# Patient Record
Sex: Female | Born: 1985
Health system: Southern US, Community
[De-identification: ages and names within clinical notes are randomized; demographics above are authoritative.]

## PROBLEM LIST (undated history)

## (undated) DIAGNOSIS — A6000 Herpesviral infection of urogenital system, unspecified: Secondary | ICD-10-CM

## (undated) DIAGNOSIS — E785 Hyperlipidemia, unspecified: Secondary | ICD-10-CM

## (undated) DIAGNOSIS — R7303 Prediabetes: Secondary | ICD-10-CM

## (undated) DIAGNOSIS — E038 Other specified hypothyroidism: Secondary | ICD-10-CM

## (undated) DIAGNOSIS — R5383 Other fatigue: Secondary | ICD-10-CM

## (undated) DIAGNOSIS — E041 Nontoxic single thyroid nodule: Secondary | ICD-10-CM

## (undated) DIAGNOSIS — Z91018 Allergy to other foods: Secondary | ICD-10-CM

## (undated) DIAGNOSIS — M25569 Pain in unspecified knee: Secondary | ICD-10-CM

## (undated) DIAGNOSIS — D509 Iron deficiency anemia, unspecified: Secondary | ICD-10-CM

## (undated) DIAGNOSIS — T7840XA Allergy, unspecified, initial encounter: Secondary | ICD-10-CM

## (undated) DIAGNOSIS — R1310 Dysphagia, unspecified: Secondary | ICD-10-CM

## (undated) DIAGNOSIS — R944 Abnormal results of kidney function studies: Secondary | ICD-10-CM

## (undated) DIAGNOSIS — G4730 Sleep apnea, unspecified: Secondary | ICD-10-CM

## (undated) DIAGNOSIS — Z973 Presence of spectacles and contact lenses: Secondary | ICD-10-CM

## (undated) DIAGNOSIS — Z8742 Personal history of other diseases of the female genital tract: Secondary | ICD-10-CM

## (undated) DIAGNOSIS — E559 Vitamin D deficiency, unspecified: Secondary | ICD-10-CM

## (undated) DIAGNOSIS — R32 Unspecified urinary incontinence: Secondary | ICD-10-CM

## (undated) DIAGNOSIS — G8929 Other chronic pain: Secondary | ICD-10-CM

## (undated) DIAGNOSIS — E669 Obesity, unspecified: Secondary | ICD-10-CM

## (undated) DIAGNOSIS — N926 Irregular menstruation, unspecified: Secondary | ICD-10-CM

## (undated) DIAGNOSIS — E7800 Pure hypercholesterolemia, unspecified: Secondary | ICD-10-CM

## (undated) DIAGNOSIS — G43909 Migraine, unspecified, not intractable, without status migrainosus: Secondary | ICD-10-CM

## (undated) DIAGNOSIS — E0789 Other specified disorders of thyroid: Secondary | ICD-10-CM

## (undated) HISTORY — DX: Migraine, unspecified, not intractable, without status migrainosus: G43.909

## (undated) HISTORY — DX: Unspecified urinary incontinence: R32

## (undated) HISTORY — DX: Prediabetes: R73.03

## (undated) HISTORY — DX: Herpesviral infection of urogenital system, unspecified: A60.00

## (undated) HISTORY — DX: Obesity, unspecified: E66.9

## (undated) HISTORY — DX: Dysphagia, unspecified: R13.10

## (undated) HISTORY — DX: Vitamin D deficiency, unspecified: E55.9

## (undated) HISTORY — DX: Personal history of other diseases of the female genital tract: Z87.42

## (undated) HISTORY — DX: Allergy, unspecified, initial encounter: T78.40XA

## (undated) HISTORY — DX: Allergy to other foods: Z91.018

## (undated) HISTORY — DX: Irregular menstruation, unspecified: N92.6

## (undated) HISTORY — DX: Sleep apnea, unspecified: G47.30

## (undated) HISTORY — DX: Nontoxic single thyroid nodule: E04.1

## (undated) HISTORY — DX: Pure hypercholesterolemia, unspecified: E78.00

## (undated) HISTORY — PX: WISDOM TOOTH EXTRACTION: SHX21

## (undated) HISTORY — DX: Other fatigue: R53.83

## (undated) HISTORY — DX: Pain in unspecified knee: M25.569

## (undated) HISTORY — DX: Other chronic pain: G89.29

## (undated) HISTORY — DX: Other specified disorders of thyroid: E07.89

---

## 2013-06-12 ENCOUNTER — Other Ambulatory Visit: Payer: Self-pay

## 2013-06-12 LAB — TSH: Thyroid Stimulating Horm: 0.65 u[IU]/mL

## 2013-09-15 ENCOUNTER — Other Ambulatory Visit: Payer: Self-pay

## 2013-09-15 LAB — TSH: Thyroid Stimulating Horm: 1.4 u[IU]/mL

## 2013-11-07 ENCOUNTER — Other Ambulatory Visit: Payer: Self-pay

## 2013-11-07 LAB — TSH: Thyroid Stimulating Horm: 1.6 u[IU]/mL

## 2013-12-13 ENCOUNTER — Inpatient Hospital Stay: Payer: Self-pay | Admitting: Obstetrics & Gynecology

## 2013-12-13 LAB — CBC WITH DIFFERENTIAL/PLATELET
Basophil #: 0.1 10*3/uL (ref 0.0–0.1)
Basophil %: 0.8 %
Eosinophil #: 0 10*3/uL (ref 0.0–0.7)
Eosinophil %: 0.4 %
HCT: 36.4 % (ref 35.0–47.0)
HGB: 11.9 g/dL — ABNORMAL LOW (ref 12.0–16.0)
Lymphocyte #: 3 10*3/uL (ref 1.0–3.6)
Lymphocyte %: 36 %
MCH: 28.6 pg (ref 26.0–34.0)
MCHC: 32.7 g/dL (ref 32.0–36.0)
MCV: 87 fL (ref 80–100)
Monocyte #: 0.7 x10 3/mm (ref 0.2–0.9)
Monocyte %: 8.5 %
Neutrophil #: 4.6 10*3/uL (ref 1.4–6.5)
Neutrophil %: 54.3 %
Platelet: 262 10*3/uL (ref 150–440)
RBC: 4.16 10*6/uL (ref 3.80–5.20)
RDW: 14.6 % — ABNORMAL HIGH (ref 11.5–14.5)
WBC: 8.5 10*3/uL (ref 3.6–11.0)

## 2013-12-15 LAB — HEMATOCRIT: HCT: 27.7 % — AB (ref 35.0–47.0)

## 2013-12-16 LAB — CBC WITH DIFFERENTIAL/PLATELET
BASOS PCT: 0.1 %
Basophil #: 0 10*3/uL (ref 0.0–0.1)
EOS PCT: 0.2 %
Eosinophil #: 0 10*3/uL (ref 0.0–0.7)
HCT: 28.3 % — ABNORMAL LOW (ref 35.0–47.0)
HGB: 9.2 g/dL — ABNORMAL LOW (ref 12.0–16.0)
LYMPHS ABS: 1.8 10*3/uL (ref 1.0–3.6)
Lymphocyte %: 9.8 %
MCH: 28.7 pg (ref 26.0–34.0)
MCHC: 32.5 g/dL (ref 32.0–36.0)
MCV: 88 fL (ref 80–100)
Monocyte #: 0.7 x10 3/mm (ref 0.2–0.9)
Monocyte %: 3.7 %
Neutrophil #: 15.4 10*3/uL — ABNORMAL HIGH (ref 1.4–6.5)
Neutrophil %: 86.2 %
Platelet: 188 10*3/uL (ref 150–440)
RBC: 3.2 10*6/uL — ABNORMAL LOW (ref 3.80–5.20)
RDW: 15.2 % — AB (ref 11.5–14.5)
WBC: 17.9 10*3/uL — ABNORMAL HIGH (ref 3.6–11.0)

## 2013-12-16 LAB — CREATININE, SERUM: CREATININE: 1.01 mg/dL (ref 0.60–1.30)

## 2013-12-18 LAB — WOUND CULTURE

## 2013-12-23 ENCOUNTER — Ambulatory Visit: Payer: Self-pay

## 2013-12-23 IMAGING — US US EXTREM LOW VENOUS BILAT
1 series · 13 of 24 positions shown · non-contrast
Comparison: None.

CLINICAL DATA: Bilateral calf pain, worse on the left, 9 days
postpartum.



[Series 1: us extrem low venous bilat · 0.09mm/px · 13 of 60 slices shown]
[im 1/60]
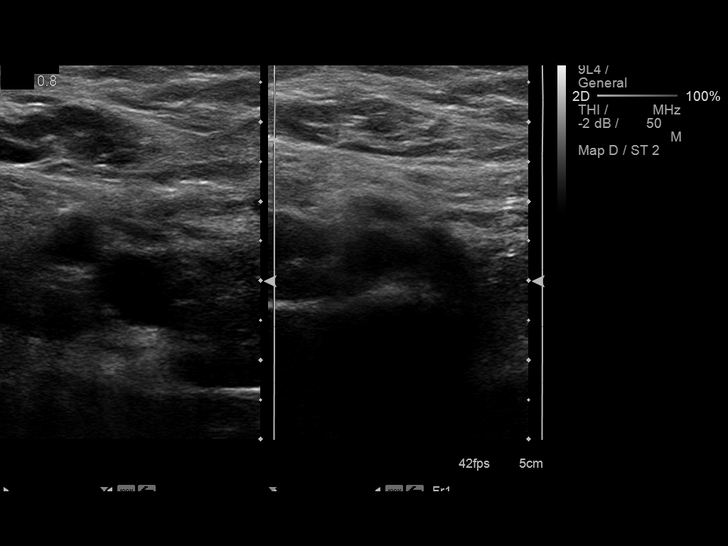
[im 6/60]
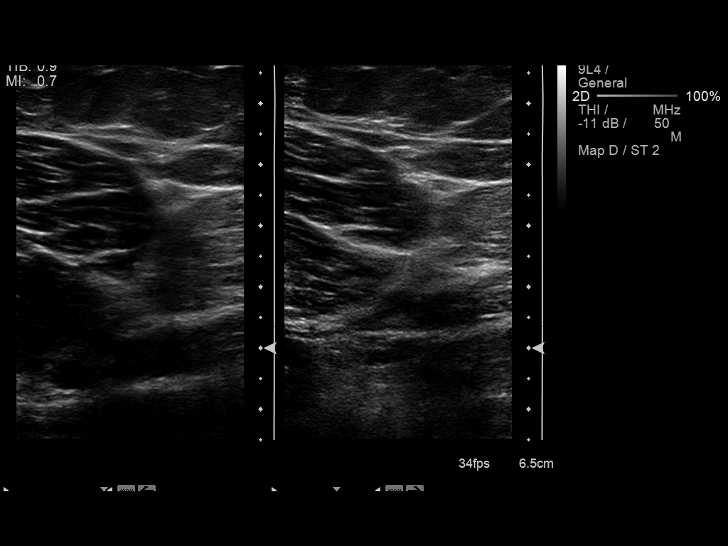
[im 11/60]
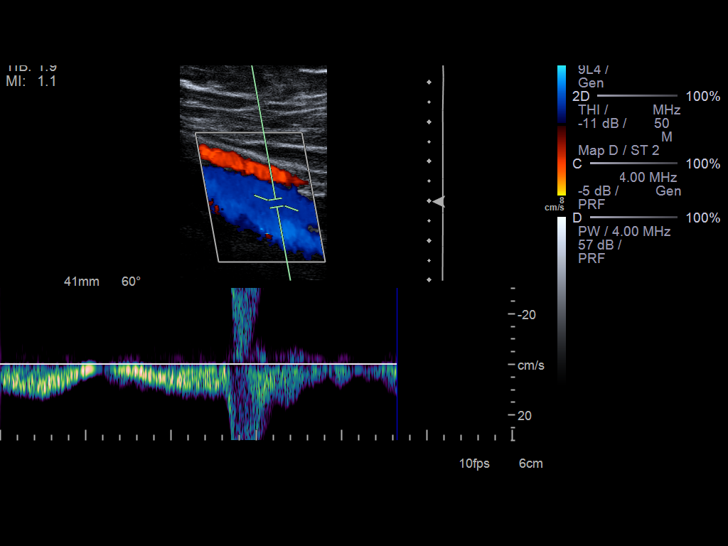
[im 16/60]
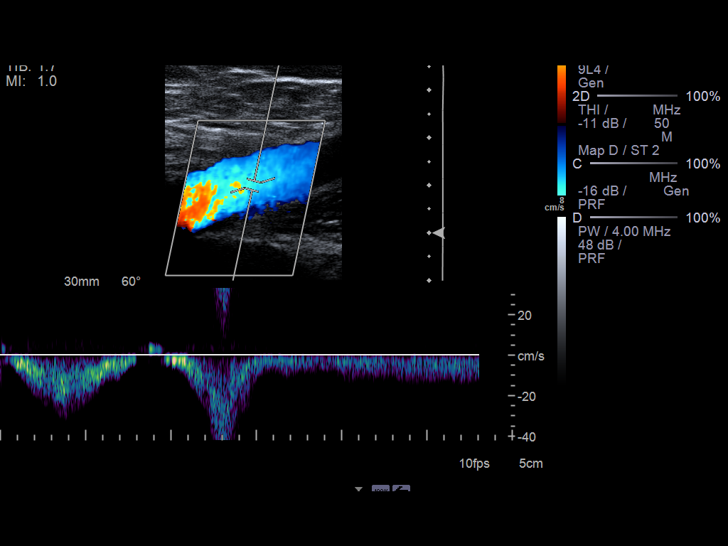
[im 21/60]
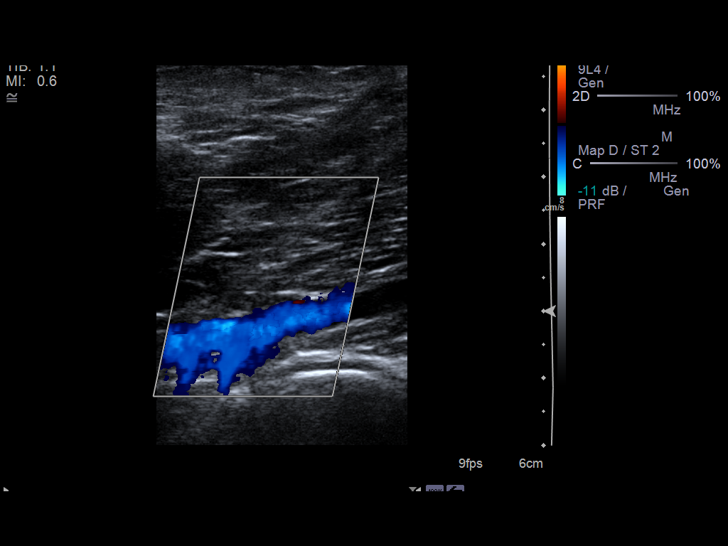
[im 26/60]
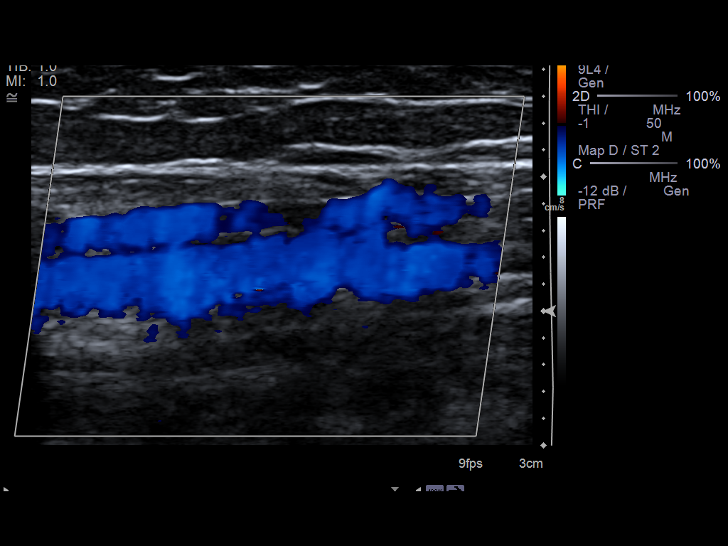
[im 31/60]
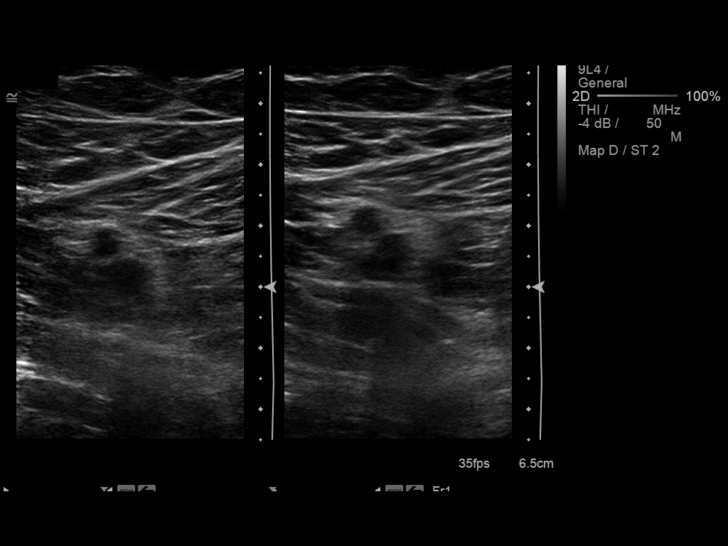
[im 34/60]
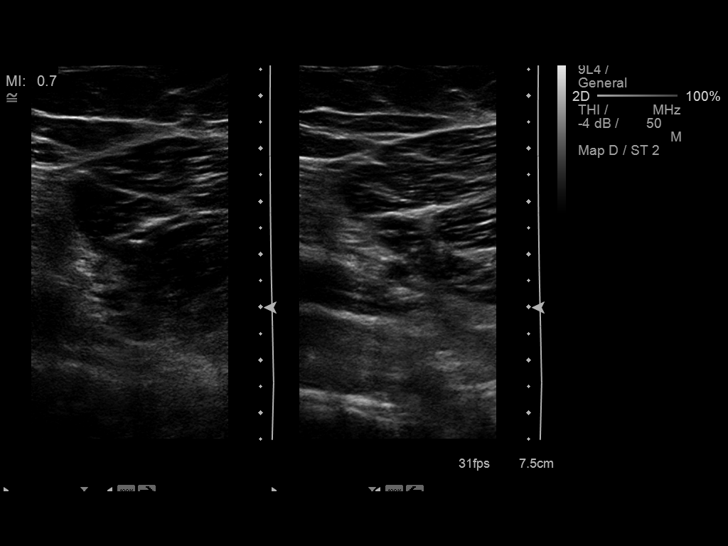
[im 39/60]
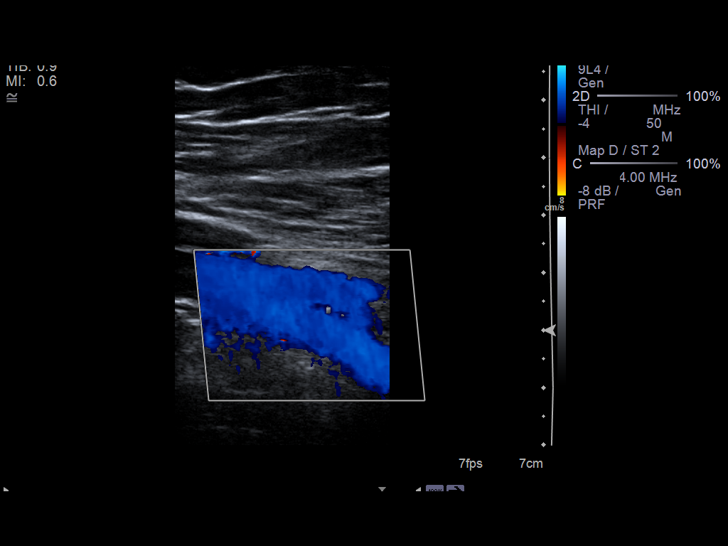
[im 44/60]
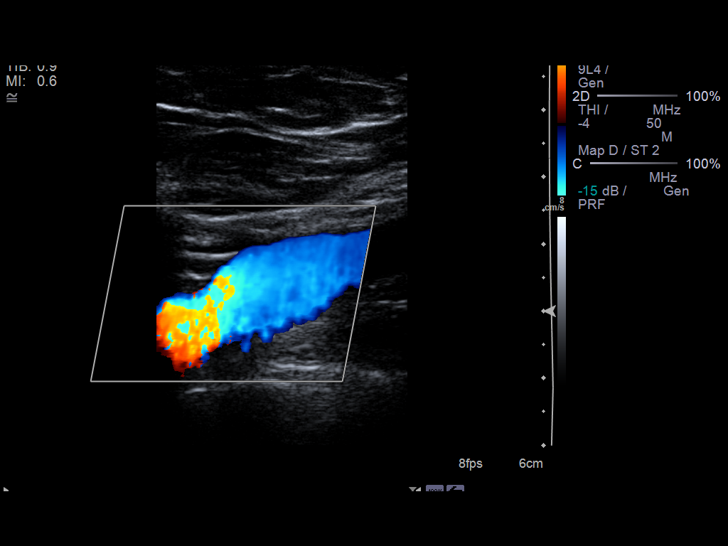
[im 49/60]
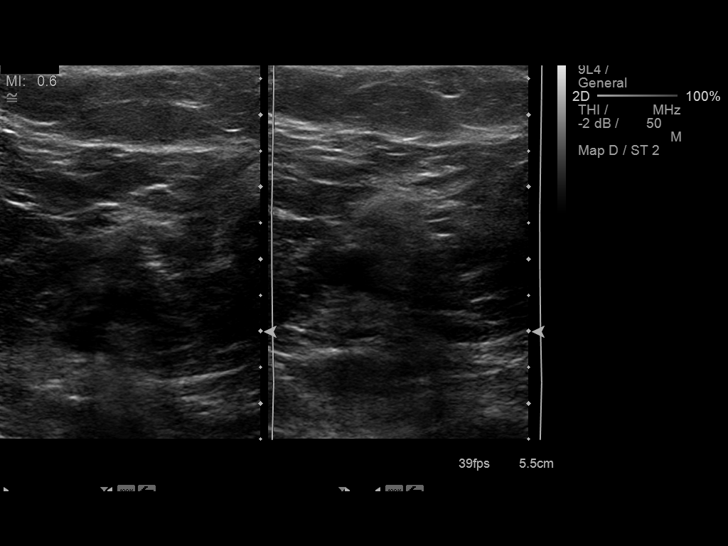
[im 54/60]
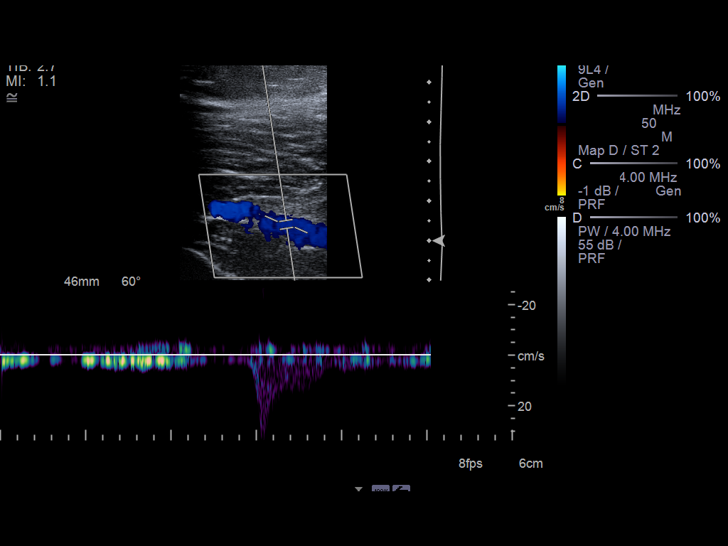
[im 60/60]
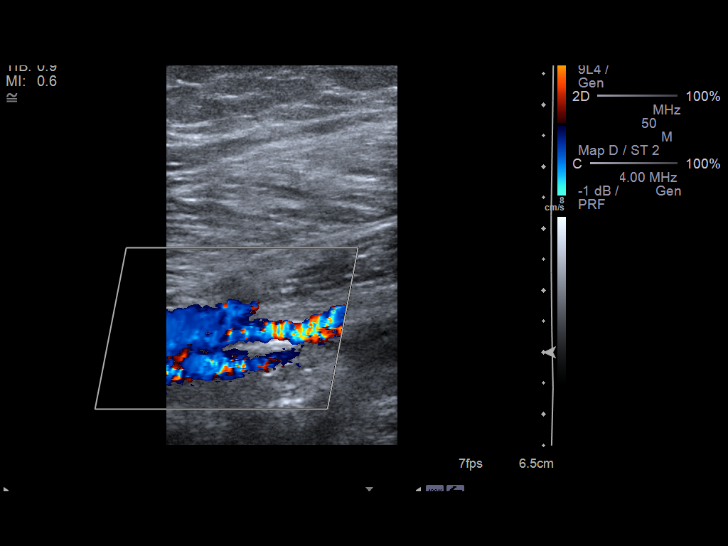

[13 of 24 positions shown; findings below may reference images not displayed]

FINDINGS: RIGHT LOWER EXTREMITY

Common Femoral Vein: No evidence of thrombus. Normal
compressibility, respiratory phasicity and response to augmentation.

Saphenofemoral Junction: No evidence of thrombus. Normal
compressibility and flow on color Doppler imaging.

Profunda Femoral Vein: No evidence of thrombus. Normal
compressibility and flow on color Doppler imaging.

Femoral Vein: No evidence of thrombus. Normal compressibility,
respiratory phasicity and response to augmentation.

Popliteal Vein: No evidence of thrombus. Normal compressibility,
respiratory phasicity and response to augmentation.

Calf Veins: No evidence of thrombus. Normal compressibility and flow
on color Doppler imaging.

Superficial Great Saphenous Vein: No evidence of thrombus. Normal
compressibility and flow on color Doppler imaging.

Venous Reflux:  None.

Other Findings:  None.

LEFT LOWER EXTREMITY

Common Femoral Vein: No evidence of thrombus. Normal
compressibility, respiratory phasicity and response to augmentation.

Saphenofemoral Junction: No evidence of thrombus. Normal
compressibility and flow on color Doppler imaging.

Profunda Femoral Vein: No evidence of thrombus. Normal
compressibility and flow on color Doppler imaging.

Femoral Vein: No evidence of thrombus. Normal compressibility,
respiratory phasicity and response to augmentation.

Popliteal Vein: No evidence of thrombus. Normal compressibility,
respiratory phasicity and response to augmentation.

Calf Veins: No evidence of thrombus. Normal compressibility and flow
on color Doppler imaging.

Superficial Great Saphenous Vein: No evidence of thrombus. Normal
compressibility and flow on color Doppler imaging.

Venous Reflux:  None.

Other Findings:  None.
IMPRESSION: No evidence of deep venous thrombosis.

These results will be called to the ordering clinician or
representative by the [HOSPITAL] at the imaging location.

## 2014-02-03 DIAGNOSIS — E038 Other specified hypothyroidism: Secondary | ICD-10-CM | POA: Insufficient documentation

## 2014-06-06 NOTE — Op Note (Signed)
PATIENT NAME:  Erin Tucker, Erin Tucker DATE OF BIRTH:  [birth year]  DATE OF PROCEDURE:  12/14/2013  PREOPERATIVE DIAGNOSES:   1.  Term intrauterine pregnancy. 2.  Chorioamnionitis. 3.  Arrest of dilation.  POSTPARTUM DIAGNOSIS:  1.  Term intrauterine pregnancy. 2.  Chorioamnionitis. 3.  Arrest of dilation. 4.  Delivery of a live female infant.  SURGEON:  Ranae Plumberhelsea Natausha Jungwirth, MD.  ASSISTANDierdre Searles:  R. Paul Harris, MD  ANESTHESIA:  Epidural.  PROCEDURE:  Primary low transverse cesarean section.   IV FLUIDS:  Crystalloid 800, blood loss 500, urine out 500.   SPECIMENS:   1.  Live female infant weighing 7 pounds 6 ounces with Apgars 7 and 9.   2.  Placenta sent to pathology. 3.  Cord blood. 4.  Blood gas, umbilical cord gas. 5.  Culture of placenta.   FINDINGS:   1.  Live female infant. 2.  A 3-vessel cord.  COMPLICATIONS:  None apparent.  DISPOSITION:  Stable to PACU.  OPERATIVE NOTE:   INDICATIONS FOR PROCEDURE: The patient is a 29 year old G1 P0 who presented with leaking of fluid with spontaneous rupture of membranes at 40 weeks and 0 days.  Pitocin induction with progression to 6 cm and inability to increase Pitocin due to tachysystole.  The patient ultimately developed chorioamnionitis with intrapartum fever of 102.0.  Antibiotics were started and the patient was consent for cesarean.  Prior to onset of the case, the fetal heart rate had a terminal bradycardia to 80 beats per minute.  A STAT section was called, and the patient was immediately taken to the OR.  Upon arrival to the OR, fetal hearts were assessed and in the 120s, thus a STAT section was not proceeded.  The patient had received a bolus of her epidural and we proceeded in an urgent fashion, not an emergent fashion.    DESCRIPTION OF PROCEDURE:  After the epidural was bolused, the patient was prepped and draped in the usual sterile fashion.  The Foley catheter was maintained as she had had an epidural prior.  The  epidural's efficacy was tested and found to be adequate.  A timeout was called.  A 20 blade was used to incise the skin in the Pfannenstiel fashion down to the fascia which was then extended laterally with Mayo scissors.  The fascia was then separated from the rectus abdominis muscles and by using 2 Kocher clamps on the superior aspect of the incision, and using blunt and sharp dissection. The Kochers were then replaced to the posterior portion of the fascia and again blunt and sharp dissection was used to separate the fascia from the rectus abdominis muscles.  The muscles were then divided in the midline and the peritoneum was identified and entered.  Two hemostat clamps were used to lift the peritoneum and Metzenbaum scissors were used to enter; and also laterally extend the peritoneum with careful attempt to avoid the bladder.  The bladder blade was then inserted.  The uterus was inspected and found to be in its usual mid plane position.  A 20 blade was then used to incise the uterus in the lower uterine segment and then entry into the uterine cavity was made manually with the surgeon's finger.  The hysterotomy was then extended laterally with traction.  Clear fluid was noted.  The baby was then delivered cephalic through this incision with a loose nuchal cord noted at the time of delivery.  Once the baby was delivered, 2 Allis clamps were placed  on the cord.  The cord was cut and the bulb suction was used on the oropharynx and the baby was handed off to the awaiting pediatrician.  Cord blood was then collected and after this, a section of cord was then thought to be collected for umbilical cord gas.  The placenta was then delivered using manual massage and traction.  The placenta was set aside in order to be cultured at the end of the procedure.  The uterus was then delivered from the abdominal cavity.  It was wrapped in a wet cloth and a dry cloth was used to evacuate the contents of blood, clots and debris.   Allis clamps were placed on either apex of the hysterotomy incision and a running locking stitch of 0-Vicryl was then used for the initial hysterotomy closure and then a second running stitch of 0-Vicryl was used to imbricate the muscular and serosal layers.   The abdominal cavity was evacuated of extraneous fluid and the uterus was returned.  The paracolic gutters were then cleared of clots and fluid.  The bladder was inspected for damage, of which there was none observed.  The peritoneum was then closed using a running stitch of 3-0 Vicryl and then the On-Q trocars were inserted.  The catheters were placed and tested for patency, and then the fascia was closed using 2 running stitches of 1-0 vicryl from either apex which were tied together at the midline.  The subcutaneous tissues were irrigated with warm water.  After a changing of the gloves, the subcutaneous tissues were reapproximated with 3-0 Vicryl and the skin was closed using a 4-0 Monocryl in a running stitch.  Steri-strips were placed over the incision, and a sterile bandage was applied.  Then 5 mL of bupivacaine were injected through the On-Q catheters and the catheter sites were covered Dermabond and then set with the Tegaderm.    The patient tolerated the procedure.  The counts were correct x 2.  The patient was then recovered in the operating room and brought to PACU in a stable condition.          ____________________________ Elenora Fender Marciano Mundt, MD ccw:DT D: 12/19/2013 10:23:25 ET T: 12/19/2013 11:31:58 ET JOB#: [id number]  cc: Leeroy Bock C. Rivaan Kendall, MD, <Dictator> Leola Brazil MD ELECTRONICALLY SIGNED 12/25/2013 8:23

## 2014-06-08 LAB — SURGICAL PATHOLOGY

## 2014-06-23 NOTE — H&P (Signed)
L&D Evaluation:  History:  HPI 29yo G1P0 @ 40/0 presented to triage with + LOF.  During evaluation FHT deceleration x 2 observed; patient kept for IOL.   -subclinical Hypothyroid on synthroid 50mcg -obese all GTT WNL -Rh negative s/p RhoGAM @ 28w -h/o HSV on valtrex, no lesions, no prodrome -GBS negative  O-/RNI Otho Darner/VI Petra Kuba/RPRNR William Dalton/HIVNR Wilburt Finlay/HBsAb neg   Presents with leaking fluid   Patient's Medical History Thyroid Disease  HSV   Patient's Surgical History none   Medications Pre Natal Vitamins  valtrex 500 bid, synthroid 50mcg daily   Allergies NKDA   Social History none   Family History Non-Contributory   ROS:  ROS All systems were reviewed.  HEENT, CNS, GI, GU, Respiratory, CV, Renal and Musculoskeletal systems were found to be normal.   Exam:  Vital Signs stable   Urine Protein not completed   General no apparent distress   Mental Status clear   Chest clear   Heart normal sinus rhythm   Abdomen gravid, non-tender   Estimated Fetal Weight Average for gestational age   Fetal Position cephalic by ultrasound   Back no CVAT   Edema 1+   Reflexes 2+   Clonus negative   Pelvic no external lesions, 3/50/-3, no herpetic lesions in vagina or cervix.   Mebranes Ruptured, +nitrazine + pooling   Description clear   FHT other, 135 mod + accels, + variable decel to 100 x 40 seconds (2nd decel)   FHT Description Variable decelerations   Fetal Heart Rate 135   Ucx absent   Impression:  Impression fetal decelerations, PROM   Plan:  Plan Pitocin Induction   Comments Patient with PROM and fetal decelerations with spontaneous resolution, confirmed cephalic position.  Will induce with pitocin and observe fetal tolerance to contractions.  Explained to patient and husband a CS will be considered if fetus is intolerant of induction or contractions, and it may be in an emergency fashion.  Consent signed with patient.  Also discussed other reasons for CS including  failure to progress, failure to descend, failed operative delivery, and maternal exhaustion, and that all options will be exhausted prior to suggesting this delivery method.   Electronic Signatures: Ward, Elenora Fenderhelsea C (MD)  (Signed [phone]-Nov-15 11:01)  Authored: L&D Evaluation   Last Updated: 01-Nov-15 11:01 by Ward, Elenora Fenderhelsea C (MD)

## 2016-03-07 DIAGNOSIS — Z6839 Body mass index (BMI) 39.0-39.9, adult: Secondary | ICD-10-CM | POA: Insufficient documentation

## 2016-07-03 HISTORY — PX: THYROIDECTOMY, PARTIAL: SHX18

## 2017-02-08 ENCOUNTER — Telehealth: Payer: Self-pay | Admitting: Obstetrics and Gynecology

## 2017-02-08 ENCOUNTER — Ambulatory Visit (INDEPENDENT_AMBULATORY_CARE_PROVIDER_SITE_OTHER): Payer: BC Managed Care – PPO | Admitting: Certified Nurse Midwife

## 2017-02-08 ENCOUNTER — Encounter: Payer: Self-pay | Admitting: Certified Nurse Midwife

## 2017-02-08 VITALS — BP 110/70 | HR 77 | Ht 61.0 in | Wt 211.0 lb

## 2017-02-08 DIAGNOSIS — Z01419 Encounter for gynecological examination (general) (routine) without abnormal findings: Secondary | ICD-10-CM | POA: Diagnosis not present

## 2017-02-08 DIAGNOSIS — E041 Nontoxic single thyroid nodule: Secondary | ICD-10-CM | POA: Insufficient documentation

## 2017-02-08 DIAGNOSIS — E669 Obesity, unspecified: Secondary | ICD-10-CM | POA: Diagnosis not present

## 2017-02-08 DIAGNOSIS — Z124 Encounter for screening for malignant neoplasm of cervix: Secondary | ICD-10-CM

## 2017-02-08 NOTE — Progress Notes (Signed)
Gynecology Annual Exam  PCP: Patient, No Pcp Per  Chief Complaint:  Chief Complaint  Patient presents with  . Gynecologic Exam    History of Present Illness: Erin Tucker is a 31 y.o.G1P1001 AA female who presents for a NP annual exam. The patient has no complaints today.   Her menses are irregular, they occur every 3-4 months, and they last 4-7 days. Her flow is moderate. She does not have intermenstrual bleeding. Her last menstrual period was 12/04/2016. She denies dysmenorrhea. Last pap smear: 08/06/2014 at her PCP, results were NIL   The patient is  sexually active. She currently uses Nexplanon for contraception. The Nexplanon was inserted 02/03/2014 and is expiring. She does want the Nexplanon replaced  Since her last  visit 02/03/2014, she has had a partial right thyroidectomy for a cold nodule. The pathology returned a follicular adenoma (benign) and she is no longer on Synthroid. She has also gained 19# in the last 3 years.   Her past medical history is remarkable for HSV1 and obesity. Her current BMI is 39.87 kg/m2.   The patient does perform occasional self breast exams. Her last mammogram was NA.  There is no family history of breast cancer.   There is no family history of ovarian cancer.   The patient denies smoking.  She reports rarely drinking alcohol.   She denies illegal drug use.  The patient does not exercise.  She had a lipid panel this year and it was borderline elevated  The patient denies current symptoms of depression.    Review of Systems: Review of Systems  Constitutional: Negative for chills, fever and weight loss.       Positive for weight gain  HENT: Negative for congestion, sinus pain and sore throat.   Eyes: Negative for blurred vision and pain.  Respiratory: Negative for hemoptysis, shortness of breath and wheezing.   Cardiovascular: Negative for chest pain, palpitations and leg swelling.  Gastrointestinal: Negative for  abdominal pain, blood in stool, diarrhea, heartburn, nausea and vomiting.  Genitourinary: Negative for dysuria, frequency, hematuria and urgency.       Positive for irregular menses.  Musculoskeletal: Positive for back pain. Negative for joint pain and myalgias.  Skin: Negative for itching and rash.  Neurological: Negative for dizziness, tingling and headaches.  Endo/Heme/Allergies: Negative for environmental allergies and polydipsia. Does not bruise/bleed easily.       Negative for hirsutism   Psychiatric/Behavioral: Negative for depression. The patient is not nervous/anxious and does not have insomnia.     Past Medical History:  Past Medical History:  Diagnosis Date  . Herpes genitalis   . History of abnormal cervical Pap smear   . Obesity (BMI 35.0-39.9 without comorbidity)   . Thyroid nodule, cold     Past Surgical History:  Past Surgical History:  Procedure Laterality Date  . CESAREAN SECTION  12/14/2013  . THYROIDECTOMY, PARTIAL Right 07/03/2016   follicular adenoma    Family History:  Family History  Problem Relation Age of Onset  . Diabetes Father   . Colon cancer Maternal Grandfather   . Prostate cancer Maternal Grandfather   . Breast cancer Neg Hx   . Ovarian cancer Neg Hx   . Heart disease Neg Hx     Social History:  Social History   Socioeconomic History  . Marital status: Single    Spouse name: Not on file  . Number of children: 1  . Years of education: Not on file  .  Highest education level: Not on file  Social Needs  . Financial resource strain: Not on file  . Food insecurity - worry: Not on file  . Food insecurity - inability: Not on file  . Transportation needs - medical: Not on file  . Transportation needs - non-medical: Not on file  Occupational History  . 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 Smoking status: Never Smoker  . Smokeless tobacco: Never Used  Substance and Sexual Activity  . Alcohol use: Yes    Comment: rare  . Drug use:  No  . Sexual activity: Yes    Partners: Male    Birth control/protection: Implant  Other Topics Concern  . Not on file  Social History Narrative  . Not on file    Allergies:  Allergies  Allergen Reactions  . Mangifera Indica Itching and Swelling  . Other Itching    Red Hi-C drink    Medications:  Current Outpatient Medications on File Prior to Visit  Medication Sig Dispense Refill  . Cholecalciferol (VITAMIN D3) 1000 units CAPS Take by mouth.    . etonogestrel (NEXPLANON) 68 MG IMPL implant 1 each by Subdermal route once.    . Prenatal Multivit-Min-Fe-FA (PRENATAL 1 + IRON PO) Take by mouth.     No current facility-administered medications on file prior to visit.    Physical Exam Vitals:BP 110/70   Pulse 77   Ht 5\' 1"  (1.549 m)   Wt 211 lb (95.7 kg)   LMP 12/04/2016 (Exact Date)   BMI 39.87 kg/m   General: AAF in  NAD HEENT: normocephalic, anicteric Neck: no thyroid enlargement, no palpable nodules, no cervical lymphadenopathy  Pulmonary: No increased work of breathing, CTAB Cardiovascular: RRR, without murmur  Breast: Breasts large and symmetrical, no tenderness, no palpable nodules or masses, no skin or nipple retraction present, no nipple discharge.  No axillary, infraclavicular or supraclavicular lymphadenopathy. Abdomen: Soft, non-tender, non-distended.  Umbilicus without lesions.  No hepatomegaly or masses palpable. No evidence of hernia. Genitourinary:  External: Normal external female genitalia.  Normal urethral meatus, normal  Bartholin's and Skene's glands.    Vagina: Normal vaginal mucosa, no evidence of prolapse.    Cervix: Grossly normal in appearance, no bleeding, non-tender  Uterus: Anteflexed and rotated to the right, normal size, shape, and consistency, mobile, and non-tender  Adnexa: No adnexal masses, non-tender  Rectal: deferred  Lymphatic: no evidence of inguinal lymphadenopathy Extremities: no edema, erythema, or tenderness Neurologic: Grossly  intact Psychiatric: mood appropriate, affect full     Assessment: 31 y.o.NP annual gyn exam Nexplanon expiring  Plan:    1) Breast cancer screening - recommend monthly self breast exam.   2) STI screening was offered and declined.  3) Cervical cancer screening - Pap was done. ASCCP guidelines and rational discussed.  Patient opts for every 3 years screening interval  4) Contraception - Education given regarding options for contraception. Discussed the weight gain she has had on the Nexplanon. Discussed alternative options for contraception. Patinet desires to return for Nexplanon replacement.  5) Routine healthcare maintenance including cholesterol and diabetes screening managed by PCP   6) RTO for Nexplaon replacement in the next month.  Farrel Connersolleen Tanush Drees, CNM

## 2017-02-08 NOTE — Telephone Encounter (Signed)
02/14/17 AT 10:50 WITH ABC FOR NEXPLANON

## 2017-02-10 LAB — IGP, APTIMA HPV
HPV APTIMA: NEGATIVE
PAP SMEAR COMMENT: 0

## 2017-02-11 ENCOUNTER — Encounter: Payer: Self-pay | Admitting: Certified Nurse Midwife

## 2017-02-14 ENCOUNTER — Encounter: Payer: Self-pay | Admitting: Obstetrics and Gynecology

## 2017-02-14 ENCOUNTER — Ambulatory Visit (INDEPENDENT_AMBULATORY_CARE_PROVIDER_SITE_OTHER): Payer: BC Managed Care – PPO | Admitting: Obstetrics and Gynecology

## 2017-02-14 VITALS — BP 110/60 | HR 85 | Ht 61.0 in | Wt 214.0 lb

## 2017-02-14 DIAGNOSIS — Z3046 Encounter for surveillance of implantable subdermal contraceptive: Secondary | ICD-10-CM

## 2017-02-14 DIAGNOSIS — Z30017 Encounter for initial prescription of implantable subdermal contraceptive: Secondary | ICD-10-CM | POA: Diagnosis not present

## 2017-02-14 MED ORDER — ETONOGESTREL 68 MG ~~LOC~~ IMPL: 68.0000 mg | DRUG_IMPLANT | Freq: Once | SUBCUTANEOUS | Status: DC

## 2017-02-14 NOTE — Patient Instructions (Signed)
I value your feedback and entrusting us with your care. If you get a [facility] patient survey, I would appreciate you taking the time to let us know about your experience today. Thank you!  Remove the dressing in 12-24 hours,  keep the incision area dry for 24 hours and remove the Steristrip in 2-3  days.  Notify us if any signs of tenderness, redness, pain, or fevers develop. 

## 2017-02-14 NOTE — Progress Notes (Signed)
   Chief Complaint  Patient presents with  . nexplanon     History of Present Illness:  Erin Tucker is a 32 y.o. that had a nexplanon placed approximately 3 yrs ago. She is here for nexplanon removal and would like a replacement due to expiration. She has irreg bleeding with nexplanon.  Nexplanon removal Procedure note - The Nexplanon was noted in the patient's arm and the end was identified. The skin was cleansed with a Betadine solution. A small injection of subcutaneous lidocaine with epinephrine was given over the end of the implant. An incision was made at the end of the implant. The rod was noted in the incision and grasped with a hemostat. It was noted to be intact.  Steri-Strip was placed approximating the incision. Hemostasis was noted.   Nexplanon Insertion  Patient given informed consent, signed copy in the chart, time out was performed.  Appropriate time out taken.  Patient's LEFT arm was prepped and draped in the usual sterile fashion. The ruler used to measure and mark insertion area.  Pt was prepped with betadine swab and then injected with 1.0 cc of 2% lidocaine with epinephrine. Nexplanon removed form packaging,  Device confirmed in needle, then inserted full length of needle and withdrawn per handbook instructions.  Pt insertion site covered with steri-strip and a bandage.   Minimal blood loss.  Pt tolerated the procedure welL.  Assessment: Nexplanon removal  Nexplanon insertion - Plan: etonogestrel (NEXPLANON) implant 68 mg   Meds ordered this encounter  Medications  . etonogestrel (NEXPLANON) implant 68 mg     Plan:   She was told to remove the dressing in 12-24 hours, to keep the incision area dry for 24 hours and to remove the Steristrip in 2-3  days.  Notify us if any signs of tenderness, redness, pain, or fevers develop.   Alicia B. Copland, PA-C 02/14/2017 11:24 AM

## 2017-04-10 NOTE — Telephone Encounter (Signed)
Nexplanon received 02/14/17

## 2017-06-18 ENCOUNTER — Other Ambulatory Visit: Payer: Self-pay

## 2017-06-18 ENCOUNTER — Ambulatory Visit
Admission: EM | Admit: 2017-06-18 | Discharge: 2017-06-18 | Disposition: A | Discharge status: Home / Self Care | Payer: BC Managed Care – PPO | Attending: Family Medicine | Admitting: Family Medicine

## 2017-06-18 DIAGNOSIS — H1013 Acute atopic conjunctivitis, bilateral: Secondary | ICD-10-CM | POA: Diagnosis not present

## 2017-06-18 MED ORDER — OLOPATADINE HCL 0.2 % OP SOLN: OPHTHALMIC | 0 refills | Status: DC

## 2017-06-18 NOTE — ED Provider Notes (Signed)
[facility]    CSN: [id number] Arrival date & time: 06/18/17  1230  History   Chief Complaint Chief Complaint  Patient presents with  . Eye Problem   HPI  32 year old female presents with the above complaint.  Patient reports that she developed eye itching on Saturday.  Has continued to persist.  No redness.  She does report watery eyes.  She did have some crusting of her left eye this morning.  Her son has recently had conjunctivitis.  No medications or interventions tried.  No visual disturbance.  She does wear contacts.  No other associated symptoms.  No other complaints or concerns at this time.  Past Medical History:  Diagnosis Date  . Herpes genitalis   . History of abnormal cervical Pap smear   . Obesity (BMI 35.0-39.9 without comorbidity)   . Thyroid nodule, cold    Patient Active Problem List   Diagnosis Date Noted  . Obesity (BMI 35.0-39.9 without comorbidity)   . Thyroid nodule, cold    Past Surgical History:  Procedure Laterality Date  . CESAREAN SECTION  12/14/2013  . THYROIDECTOMY, PARTIAL Right 07/03/2016   follicular adenoma   OB History    Gravida  1   Para  1   Term  1   Preterm      AB      Living  1     SAB      TAB      Ectopic      Multiple      Live Births  1          Home Medications    Prior to Admission medications   Medication Sig Start Date End Date Taking? Authorizing Provider  Cholecalciferol (VITAMIN D3) 1000 units CAPS Take by mouth.   Yes [provider]  Prenatal Multivit-Min-Fe-FA (PRENATAL 1 + IRON PO) Take by mouth.   Yes [provider]  Olopatadine HCl 0.2 % SOLN 1 drop to both eyes daily. 06/18/17   Tommie Sams, DO    Family History Family History  Problem Relation Age of Onset  . Diabetes Father   . Colon cancer Maternal Grandfather   . Prostate cancer Maternal Grandfather   . Breast cancer Neg Hx   . Ovarian cancer Neg Hx   . Heart disease Neg Hx     Social  History Social History   Tobacco Use  . Smoking status: Never Smoker  . Smokeless tobacco: Never Used  Substance Use Topics  . Alcohol use: Yes    Comment: rare  . Drug use: No     Allergies   Mangifera indica and Other   Review of Systems Review of Systems  Constitutional: Negative.   Eyes: Positive for itching. Negative for redness and visual disturbance.   Physical Exam Triage Vital Signs ED Triage Vitals  Enc Vitals Group     BP 06/18/17 1249 110/73     Pulse Rate 06/18/17 1249 77     Resp 06/18/17 1249 17     Temp 06/18/17 1249 98.4 F (36.9 C)     Temp Source 06/18/17 1249 Oral     SpO2 06/18/17 1249 100 %     Weight 06/18/17 1247 207 lb (93.9 kg)     Height 06/18/17 1247  (1.549 m)     Head Circumference --      Peak Flow --      Pain Score 06/18/17 1247 0     Pain Loc --  Pain Edu? --      Excl. in GC? --    Updated Vital Signs BP 110/73 (BP Location: Right Arm)   Pulse 77   Temp 98.4 F (36.9 C) (Oral)   Resp 17   Ht  (1.549 m)   Wt 207 lb (93.9 kg)   SpO2 100%   BMI 39.11 kg/m   Visual Acuity Right Eye Distance: 20/25(corrected) Left Eye Distance: 20/25(corrected) Bilateral Distance: 20/15(corrected)  Right Eye Near:   Left Eye Near:    Bilateral Near:     Physical Exam  Constitutional: She is oriented to person, place, and time. She appears well-developed. No distress.  HENT:  Head: Normocephalic and atraumatic.  Eyes: Pupils are equal, round, and reactive to light. Conjunctivae are normal. Right eye exhibits no discharge. Left eye exhibits no discharge.  Cardiovascular: Normal rate and regular rhythm.  Pulmonary/Chest: Effort normal and breath sounds normal.  Neurological: She is alert and oriented to person, place, and time.  Psychiatric: She has a normal mood and affect. Her behavior is normal.  Nursing note and vitals reviewed.  UC Treatments / Results  Labs (all labs ordered are listed, but only abnormal results  are displayed) Labs Reviewed - No data to display  EKG None  Radiology No results found.  Procedures Procedures (including critical care time)  Medications Ordered in UC Medications - No data to display  Initial Impression / Assessment and Plan / UC Course  I have reviewed the triage vital signs and the nursing notes.  Pertinent labs & imaging results that were available during my care of the patient were reviewed by me and considered in my medical decision making (see chart for details).    32 year old female presents with allergic conjunctivitis.  Treating with Pataday.  Final Clinical Impressions(s) / UC Diagnoses   Final diagnoses:  Allergic conjunctivitis of both eyes   ED Prescriptions    Medication Sig Dispense Auth. Provider   Olopatadine HCl 0.2 % SOLN 1 drop to both eyes daily. 2.5 mL Tommie Sams, DO     Controlled Substance Prescriptions [place] Controlled Substance Registry consulted? Not Applicable   Tommie Sams, DO 06/18/17 1346

## 2017-06-18 NOTE — ED Triage Notes (Signed)
Patient complains of bilateral eye itching that started over the weekend. Patient states that she has not noticed any redness but son was just diagnosed with pink eye.

## 2018-03-08 ENCOUNTER — Ambulatory Visit
Admission: EM | Admit: 2018-03-08 | Discharge: 2018-03-08 | Disposition: A | Discharge status: Home / Self Care | Payer: BC Managed Care – PPO | Attending: Family Medicine | Admitting: Family Medicine

## 2018-03-08 ENCOUNTER — Other Ambulatory Visit: Payer: Self-pay

## 2018-03-08 ENCOUNTER — Encounter: Payer: Self-pay | Admitting: Emergency Medicine

## 2018-03-08 DIAGNOSIS — J069 Acute upper respiratory infection, unspecified: Secondary | ICD-10-CM | POA: Insufficient documentation

## 2018-03-08 LAB — RAPID INFLUENZA A&B ANTIGENS (ARMC ONLY): INFLUENZA B (ARMC): NEGATIVE

## 2018-03-08 LAB — RAPID INFLUENZA A&B ANTIGENS: Influenza A (ARMC): NEGATIVE

## 2018-03-08 NOTE — ED Triage Notes (Signed)
Patient c/o chills, cough, body aches x 3 days ago.

## 2018-03-08 NOTE — ED Provider Notes (Signed)
[facility]    CSN: [id number] Arrival date & time: 03/08/18  1018     History   Chief Complaint Chief Complaint  Patient presents with  . Cough  . Generalized Body Aches  . Chills    HPI Erin Tucker is a 33 y.o. female.   The history is provided by the patient.  URI  Presenting symptoms: congestion, cough, fatigue and rhinorrhea   Severity:  Moderate Onset quality:  Sudden Timing:  Constant Progression:  Unchanged Chronicity:  New Relieved by:  None tried Ineffective treatments:  None tried Associated symptoms: myalgias   Associated symptoms: no wheezing   Risk factors: sick contacts     Past Medical History:  Diagnosis Date  . Herpes genitalis   . History of abnormal cervical Pap smear   . Obesity (BMI 35.0-39.9 without comorbidity)   . Thyroid nodule, cold     Patient Active Problem List   Diagnosis Date Noted  . Obesity (BMI 35.0-39.9 without comorbidity)   . Thyroid nodule, cold     Past Surgical History:  Procedure Laterality Date  . CESAREAN SECTION  12/14/2013  . THYROIDECTOMY, PARTIAL Right 07/03/2016   follicular adenoma    OB History    Gravida  1   Para  1   Term  1   Preterm      AB      Living  1     SAB      TAB      Ectopic      Multiple      Live Births  1            Home Medications    Prior to Admission medications   Medication Sig Start Date End Date Taking? Authorizing Provider  Cholecalciferol (VITAMIN D3) 1000 units CAPS Take by mouth.    [provider]  Olopatadine HCl 0.2 % SOLN 1 drop to both eyes daily. 06/18/17   Tommie Sams, DO  Prenatal Multivit-Min-Fe-FA (PRENATAL 1 + IRON PO) Take by mouth.    [provider]    Family History Family History  Problem Relation Age of Onset  . Diabetes Father   . Colon cancer Maternal Grandfather   . Prostate cancer Maternal Grandfather   . Breast cancer Neg Hx   . Ovarian cancer Neg Hx   . Heart disease Neg Hx      Social History Social History   Tobacco Use  . Smoking status: Never Smoker  . Smokeless tobacco: Never Used  Substance Use Topics  . Alcohol use: Yes    Comment: rare  . Drug use: No     Allergies   Mangifera indica and Other   Review of Systems Review of Systems  Constitutional: Positive for fatigue.  HENT: Positive for congestion and rhinorrhea.   Respiratory: Positive for cough. Negative for wheezing.   Musculoskeletal: Positive for myalgias.     Physical Exam Triage Vital Signs ED Triage Vitals  Enc Vitals Group     BP 03/08/18 1100 120/82     Pulse Rate 03/08/18 1100 79     Resp 03/08/18 1100 18     Temp 03/08/18 1100 99.3 F (37.4 C)     Temp Source 03/08/18 1100 Oral     SpO2 03/08/18 1100 100 %     Weight 03/08/18 1059 218 lb (98.9 kg)     Height 03/08/18 1059 5\' 1"  (1.549 m)     Head Circumference --  Peak Flow --      Pain Score 03/08/18 1059 4     Pain Loc --      Pain Edu? --      Excl. in GC? --    No data found.  Updated Vital Signs BP 120/82 (BP Location: Left Arm)   Pulse 79   Temp 99.3 F (37.4 C) (Oral)   Resp 18   Ht 5\' 1"  (1.549 m)   Wt 98.9 kg   SpO2 100%   BMI 41.19 kg/m   Visual Acuity Right Eye Distance:   Left Eye Distance:   Bilateral Distance:    Right Eye Near:   Left Eye Near:    Bilateral Near:     Physical Exam Vitals signs and nursing note reviewed.  Constitutional:      General: She is not in acute distress.    Appearance: Normal appearance. She is well-developed. She is not ill-appearing, toxic-appearing or diaphoretic.  HENT:     Head: Normocephalic and atraumatic.     Right Ear: Tympanic membrane normal.     Left Ear: Tympanic membrane normal.     Nose: Mucosal edema and rhinorrhea present. No nasal deformity, septal deviation or laceration.     Right Sinus: Maxillary sinus tenderness and frontal sinus tenderness present.     Left Sinus: Maxillary sinus tenderness and frontal sinus  tenderness present.     Mouth/Throat:     Pharynx: Uvula midline. Posterior oropharyngeal erythema present. No oropharyngeal exudate.  Neck:     Musculoskeletal: Normal range of motion and neck supple.     Thyroid: No thyromegaly.  Cardiovascular:     Rate and Rhythm: Normal rate and regular rhythm.     Heart sounds: Normal heart sounds.  Pulmonary:     Effort: Pulmonary effort is normal. No respiratory distress.     Breath sounds: Normal breath sounds. No stridor. No wheezing, rhonchi or rales.  Lymphadenopathy:     Cervical: No cervical adenopathy.  Neurological:     Mental Status: She is alert.      UC Treatments / Results  Labs (all labs ordered are listed, but only abnormal results are displayed) Labs Reviewed  RAPID INFLUENZA A&B ANTIGENS ([facility] ONLY)    EKG None  Radiology No results found.  Procedures Procedures (including critical care time)  Medications Ordered in UC Medications - No data to display  Initial Impression / Assessment and Plan / UC Course  I have reviewed the triage vital signs and the nursing notes.  Pertinent labs & imaging results that were available during my care of the patient were reviewed by me and considered in my medical decision making (see chart for details).      Final Clinical Impressions(s) / UC Diagnoses   Final diagnoses:  Viral URI    ED Prescriptions    None     1. Lab results and diagnosis reviewed with patient 2. Recommend supportive treatment with otc meds, rest, fluids 3. Follow-up prn if symptoms worsen or don't improve  Controlled Substance Prescriptions [place] Controlled Substance Registry consulted? Not Applicable   Payton Mccallum, MD 03/08/18 [phone]

## 2018-03-08 NOTE — ED Triage Notes (Signed)
Patient requesting to be tested for the flu.

## 2018-12-15 DIAGNOSIS — U071 COVID-19: Secondary | ICD-10-CM

## 2018-12-15 HISTORY — DX: COVID-19: U07.1

## 2019-01-01 ENCOUNTER — Other Ambulatory Visit: Payer: Self-pay

## 2019-02-26 DIAGNOSIS — H5213 Myopia, bilateral: Secondary | ICD-10-CM | POA: Diagnosis not present

## 2019-08-21 ENCOUNTER — Encounter: Payer: Self-pay | Admitting: Nurse Practitioner

## 2019-08-21 ENCOUNTER — Other Ambulatory Visit: Payer: Self-pay

## 2019-08-21 ENCOUNTER — Ambulatory Visit (INDEPENDENT_AMBULATORY_CARE_PROVIDER_SITE_OTHER): Payer: 59 | Admitting: Nurse Practitioner

## 2019-08-21 VITALS — BP 110/64 | HR 76 | Temp 98.3°F | Ht 61.0 in | Wt 217.0 lb

## 2019-08-21 DIAGNOSIS — E038 Other specified hypothyroidism: Secondary | ICD-10-CM

## 2019-08-21 DIAGNOSIS — Z6841 Body Mass Index (BMI) 40.0 and over, adult: Secondary | ICD-10-CM

## 2019-08-21 DIAGNOSIS — E039 Hypothyroidism, unspecified: Secondary | ICD-10-CM | POA: Diagnosis not present

## 2019-08-21 DIAGNOSIS — E66813 Obesity, class 3: Secondary | ICD-10-CM

## 2019-08-21 DIAGNOSIS — Z0000 Encounter for general adult medical examination without abnormal findings: Secondary | ICD-10-CM

## 2019-08-21 DIAGNOSIS — Z23 Encounter for immunization: Secondary | ICD-10-CM

## 2019-08-21 DIAGNOSIS — Z0001 Encounter for general adult medical examination with abnormal findings: Secondary | ICD-10-CM

## 2019-08-21 DIAGNOSIS — R3915 Urgency of urination: Secondary | ICD-10-CM | POA: Diagnosis not present

## 2019-08-21 DIAGNOSIS — R350 Frequency of micturition: Secondary | ICD-10-CM

## 2019-08-21 LAB — URINALYSIS, ROUTINE W REFLEX MICROSCOPIC
Bilirubin Urine: NEGATIVE
Hgb urine dipstick: NEGATIVE
Ketones, ur: NEGATIVE
Nitrite: POSITIVE — AB
Specific Gravity, Urine: 1.02 (ref 1.000–1.030)
Total Protein, Urine: NEGATIVE
Urine Glucose: NEGATIVE
Urobilinogen, UA: 0.2 (ref 0.0–1.0)
pH: 7.5 (ref 5.0–8.0)

## 2019-08-21 LAB — LIPID PANEL
Cholesterol: 201 mg/dL — ABNORMAL HIGH (ref 0–200)
HDL: 49.6 mg/dL (ref >39.00)
LDL Cholesterol: 121 mg/dL — ABNORMAL HIGH (ref 0–99)
NonHDL: 151.14
Total CHOL/HDL Ratio: 4
Triglycerides: 152 mg/dL — ABNORMAL HIGH (ref 0.0–149.0)
VLDL: 30.4 mg/dL (ref 0.0–40.0)

## 2019-08-21 LAB — CBC WITH DIFFERENTIAL/PLATELET
Basophils Absolute: 0 10*3/uL (ref 0.0–0.1)
Basophils Relative: 0.4 % (ref 0.0–3.0)
Eosinophils Absolute: 0 10*3/uL (ref 0.0–0.7)
Eosinophils Relative: 0.5 % (ref 0.0–5.0)
HCT: 37.7 % (ref 36.0–46.0)
Hemoglobin: 12.4 g/dL (ref 12.0–15.0)
Lymphocytes Relative: 38.9 % (ref 12.0–46.0)
Lymphs Abs: 3 10*3/uL (ref 0.7–4.0)
MCHC: 32.8 g/dL (ref 30.0–36.0)
MCV: 85.5 fl (ref 78.0–100.0)
Monocytes Absolute: 0.4 10*3/uL (ref 0.1–1.0)
Monocytes Relative: 5.2 % (ref 3.0–12.0)
Neutro Abs: 4.2 10*3/uL (ref 1.4–7.7)
Neutrophils Relative %: 55 % (ref 43.0–77.0)
Platelets: 333 10*3/uL (ref 150.0–400.0)
RBC: 4.41 Mil/uL (ref 3.87–5.11)
RDW: 14.1 % (ref 11.5–15.5)
WBC: 7.7 10*3/uL (ref 4.0–10.5)

## 2019-08-21 LAB — COMPREHENSIVE METABOLIC PANEL
ALT: 21 U/L (ref 0–35)
AST: 15 U/L (ref 0–37)
Albumin: 4.1 g/dL (ref 3.5–5.2)
Alkaline Phosphatase: 74 U/L (ref 39–117)
BUN: 12 mg/dL (ref 6–23)
CO2: 31 mEq/L (ref 19–32)
Calcium: 9.5 mg/dL (ref 8.4–10.5)
Chloride: 104 mEq/L (ref 96–112)
Creatinine, Ser: 1.05 mg/dL (ref 0.40–1.20)
GFR: 73.18 mL/min (ref >60.00)
Glucose, Bld: 92 mg/dL (ref 70–99)
Potassium: 4 mEq/L (ref 3.5–5.1)
Sodium: 139 mEq/L (ref 135–145)
Total Bilirubin: 0.7 mg/dL (ref 0.2–1.2)
Total Protein: 6.8 g/dL (ref 6.0–8.3)

## 2019-08-21 LAB — HEMOGLOBIN A1C: Hgb A1c MFr Bld: 5.6 % (ref 4.6–6.5)

## 2019-08-21 LAB — B12 AND FOLATE PANEL
Folate: 8.9 ng/mL (ref >5.9)
Vitamin B-12: 382 pg/mL (ref 211–911)

## 2019-08-21 LAB — TSH: TSH: 1.72 u[IU]/mL (ref 0.35–4.50)

## 2019-08-21 NOTE — Patient Instructions (Addendum)
It was really nice to meet you today.  Please go to the lab for routine blood work today.    Due for repeat Tdap will get this today.  Make appointment with your gynecologist to catch up with Pap test and discuss hormonal irregularity.  You are concerned about polycystic ovarian disease.  I made referral for her weight loss group.  Follow-up office visit in 6 months to check progress.  . Anticipatory guidance: Patient counseled regarding regular dental exams q6 months, eye exams yearly-. Wears contacts.   Risk factor reduction:  Advised patient of need for regular exercise and diet rich and fruits and vegetables to reduce risk of heart attack and stroke. Exercise- not exercising and recommend walking or other enjoyable aerobic exercise. Diet- needs improved- review info below.       Preventive Care 52-76 Years Old, Female Preventive care refers to visits with your health care provider and lifestyle choices that can promote health and wellness. This includes:  A yearly physical exam. This may also be called an annual well check.  Regular dental visits and eye exams.  Immunizations.  Screening for certain conditions.  Healthy lifestyle choices, such as eating a healthy diet, getting regular exercise, not using drugs or products that contain nicotine and tobacco, and limiting alcohol use. What can I expect for my preventive care visit? Physical exam Your health care provider will check your:  Height and weight. This may be used to calculate body mass index (BMI), which tells if you are at a healthy weight.  Heart rate and blood pressure.  Skin for abnormal spots. Counseling Your health care provider may ask you questions about your:  Alcohol, tobacco, and drug use.  Emotional well-being.  Home and relationship well-being.  Sexual activity.  Eating habits.  Work and work Statistician.  Method of birth control.  Menstrual cycle.  Pregnancy history. What  immunizations do I need?  Influenza (flu) vaccine  This is recommended every year. Tetanus, diphtheria, and pertussis (Tdap) vaccine  You may need a Td booster every 10 years. Varicella (chickenpox) vaccine  You may need this if you have not been vaccinated. Human papillomavirus (HPV) vaccine  If recommended by your health care provider, you may need three doses over 6 months. Measles, mumps, and rubella (MMR) vaccine  You may need at least one dose of MMR. You may also need a second dose. Meningococcal conjugate (MenACWY) vaccine  One dose is recommended if you are age 34-21 years and a first-year college student living in a residence hall, or if you have one of several medical conditions. You may also need additional booster doses. Pneumococcal conjugate (PCV13) vaccine  You may need this if you have certain conditions and were not previously vaccinated. Pneumococcal polysaccharide (PPSV23) vaccine  You may need one or two doses if you smoke cigarettes or if you have certain conditions. Hepatitis A vaccine  You may need this if you have certain conditions or if you travel or work in places where you may be exposed to hepatitis A. Hepatitis B vaccine  You may need this if you have certain conditions or if you travel or work in places where you may be exposed to hepatitis B. Haemophilus influenzae type b (Hib) vaccine  You may need this if you have certain conditions. You may receive vaccines as individual doses or as more than one vaccine together in one shot (combination vaccines). Talk with your health care provider about the risks and benefits of combination vaccines.  What tests do I need?  Blood tests  Lipid and cholesterol levels. These may be checked every 5 years starting at age 40.  Hepatitis C test.  Hepatitis B test. Screening  Diabetes screening. This is done by checking your blood sugar (glucose) after you have not eaten for a while (fasting).  Sexually  transmitted disease (STD) testing.  BRCA-related cancer screening. This may be done if you have a family history of breast, ovarian, tubal, or peritoneal cancers.  Pelvic exam and Pap test. This may be done every 3 years starting at age 74. Starting at age 67, this may be done every 5 years if you have a Pap test in combination with an HPV test. Talk with your health care provider about your test results, treatment options, and if necessary, the need for more tests. Follow these instructions at home: Eating and drinking   Eat a diet that includes fresh fruits and vegetables, whole grains, lean protein, and low-fat dairy.  Take vitamin and mineral supplements as recommended by your health care provider.  Do not drink alcohol if: ? Your health care provider tells you not to drink. ? You are pregnant, may be pregnant, or are planning to become pregnant.  If you drink alcohol: ? Limit how much you have to 0-1 drink a day. ? Be aware of how much alcohol is in your drink. In the [place] one drink equals one 12 oz bottle of beer (355 mL), one 5 oz glass of wine (148 mL), or one 1 oz glass of hard liquor (44 mL). Lifestyle  Take daily care of your teeth and gums.  Stay active. Exercise for at least 30 minutes on 5 or more days each week.  Do not use any products that contain nicotine or tobacco, such as cigarettes, e-cigarettes, and chewing tobacco. If you need help quitting, ask your health care provider.  If you are sexually active, practice safe sex. Use a condom or other form of birth control (contraception) in order to prevent pregnancy and STIs (sexually transmitted infections). If you plan to become pregnant, see your health care provider for a preconception visit. What's next?  Visit your health care provider once a year for a well check visit.  Ask your health care provider how often you should have your eyes and teeth checked.  Stay up to date on all vaccines. This information  is not intended to replace advice given to you by your health care provider. Make sure you discuss any questions you have with your health care provider. Document Revised: 10/11/2017 Document Reviewed: 10/11/2017 Elsevier Patient Education  [place].  Obesity, Adult Obesity is the condition of having too much total body fat. Being overweight or obese means that your weight is greater than what is considered healthy for your body size. Obesity is determined by a measurement called BMI. BMI is an estimate of body fat and is calculated from height and weight. For adults, a BMI of 30 or higher is considered obese. Obesity can lead to other health concerns and major illnesses, including:  Stroke.  Coronary artery disease (CAD).  Type 2 diabetes.  Some types of cancer, including cancers of the colon, breast, uterus, and gallbladder.  Osteoarthritis.  High blood pressure (hypertension).  High cholesterol.  Sleep apnea.  Gallbladder stones.  Infertility problems. What are the causes? Common causes of this condition include:  Eating daily meals that are high in calories, sugar, and fat.  Being born with genes that may  make you more likely to become obese.  Having a medical condition that causes obesity, including: ? Hypothyroidism. ? Polycystic ovarian syndrome (PCOS). ? Binge-eating disorder. ? Cushing syndrome.  Taking certain medicines, such as steroids, antidepressants, and seizure medicines.  Not being physically active (sedentary lifestyle).  Not getting enough sleep.  Drinking high amounts of sugar-sweetened beverages, such as soft drinks. What increases the risk? The following factors may make you more likely to develop this condition:  Having a family history of obesity.  Being a woman of African American descent.  Being a man of Hispanic descent.  Living in an area with limited access to: ? Romilda Garret, recreation centers, or sidewalks. ? Healthy food  choices, such as grocery stores and farmers' markets. What are the signs or symptoms? The main sign of this condition is having too much body fat. How is this diagnosed? This condition is diagnosed based on:  Your BMI. If you are an adult with a BMI of 30 or higher, you are considered obese.  Your waist circumference. This measures the distance around your waistline.  Your skinfold thickness. Your health care provider may gently pinch a fold of your skin and measure it. You may have other tests to check for underlying conditions. How is this treated? Treatment for this condition often includes changing your lifestyle. Treatment may include some or all of the following:  Dietary changes. This may include developing a healthy meal plan.  Regular physical activity. This may include activity that causes your heart to beat faster (aerobic exercise) and strength training. Work with your health care provider to design an exercise program that works for you.  Medicine to help you lose weight if you are unable to lose 1 pound a week after 6 weeks of healthy eating and more physical activity.  Treating conditions that cause the obesity (underlying conditions).  Surgery. Surgical options may include gastric banding and gastric bypass. Surgery may be done if: ? Other treatments have not helped to improve your condition. ? You have a BMI of 40 or higher. ? You have life-threatening health problems related to obesity. Follow these instructions at home: Eating and drinking   Follow recommendations from your health care provider about what you eat and drink. Your health care provider may advise you to: ? Limit fast food, sweets, and processed snack foods. ? Choose low-fat options, such as low-fat milk instead of whole milk. ? Eat 5 or more servings of fruits or vegetables every day. ? Eat at home more often. This gives you more control over what you eat. ? Choose healthy foods when you eat  out. ? Learn to read food labels. This will help you understand how much food is considered 1 serving. ? Learn what a healthy serving size is. ? Keep low-fat snacks available. ? Limit sugary drinks, such as soda, fruit juice, sweetened iced tea, and flavored milk.  Drink enough water to keep your urine pale yellow.  Do not follow a fad diet. Fad diets can be unhealthy and even dangerous. Physical activity  Exercise regularly, as told by your health care provider. ? Most adults should get up to 150 minutes of moderate-intensity exercise every week. ? Ask your health care provider what types of exercise are safe for you and how often you should exercise.  Warm up and stretch before being active.  Cool down and stretch after being active.  Rest between periods of activity. Lifestyle  Work with your health care provider and a  dietitian to set a weight-loss goal that is healthy and reasonable for you.  Limit your screen time.  Find ways to reward yourself that do not involve food.  Do not drink alcohol if: ? Your health care provider tells you not to drink. ? You are pregnant, may be pregnant, or are planning to become pregnant.  If you drink alcohol: ? Limit how much you use to:  0-1 drink a day for women.  0-2 drinks a day for men. ? Be aware of how much alcohol is in your drink. In the [place] one drink equals one 12 oz bottle of beer (355 mL), one 5 oz glass of wine (148 mL), or one 1 oz glass of hard liquor (44 mL). General instructions  Keep a weight-loss journal to keep track of the food you eat and how much exercise you get.  Take over-the-counter and prescription medicines only as told by your health care provider.  Take vitamins and supplements only as told by your health care provider.  Consider joining a support group. Your health care provider may be able to recommend a support group.  Keep all follow-up visits as told by your health care provider. This is  important. Contact a health care provider if:  You are unable to meet your weight loss goal after 6 weeks of dietary and lifestyle changes. Get help right away if you are having:  Trouble breathing.  Suicidal thoughts or behaviors. Summary  Obesity is the condition of having too much total body fat.  Being overweight or obese means that your weight is greater than what is considered healthy for your body size.  Work with your health care provider and a dietitian to set a weight-loss goal that is healthy and reasonable for you.  Exercise regularly, as told by your health care provider. Ask your health care provider what types of exercise are safe for you and how often you should exercise. This information is not intended to replace advice given to you by your health care provider. Make sure you discuss any questions you have with your health care provider. Document Revised: 10/04/2017 Document Reviewed: 10/04/2017 Elsevier Patient Education  2020 [employer].

## 2019-08-21 NOTE — Progress Notes (Signed)
New Patient Office Visit  Subjective:  Patient ID: Erin Tucker, female    DOB: Jun 01, 1985  Age: 34 y.o. MRN: [id number]  CC:  Chief Complaint  Patient presents with  . New Patient (Initial Visit)    establish care    HPI Erin Tucker is a 34 year old who presents to establish care with a primary care provider.  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  She has no health concerns except that she has had irregular menses for many years.  She just had her second menstrual cycle in 4 years. She would like her hormones checked. She is followed by GYN.   BMI 41.00: She would like to lose weight and has tried various diets.  She would be agreeable to getting into a formal nutrition weight loss program.    Immunizations: Needs Tdap. She declines Covid vaccine.  Diet:no  Exercise: no  Colonoscopy: mat gf colon cancer Pap Smear: last 3 years ago remote abnl  Vision: UTD Dentist: UTD Seatbelt: yes Sunscreen: yes ETOH: a few times a month  Tobacco: Never smoked  Past Medical History:  Diagnosis Date  . COVID-19 virus infection 12/2018  . Herpes genitalis   . History of abnormal cervical Pap smear   . Obesity (BMI 35.0-39.9 without comorbidity)   . Thyroid nodule, cold   . Urine incontinence     Past Surgical History:  Procedure Laterality Date  . CESAREAN SECTION  12/14/2013  . THYROIDECTOMY, PARTIAL Right 07/03/2016   follicular adenoma    Family History  Problem Relation Age of Onset  . Diabetes Father   . Drug abuse Father   . Colon cancer Maternal Grandfather   . Prostate cancer Maternal Grandfather   . Cancer Maternal Grandfather   . Asthma Sister   . Cancer Brother   . Early death Paternal Grandfather   . Breast cancer Neg Hx   . Ovarian cancer Neg Hx   . Heart disease Neg Hx      Social History   Socioeconomic History  . Marital status: Single    Spouse name: Not on file  . Number of children: 1  . Years of education: Not on file  . Highest education level: Bachelor's degree (e.g., BA, AB, BS)  Occupational History  . Occupation: Charity fundraiser in [employer]  Tobacco Use  . Smoking status: Never Smoker  . Smokeless tobacco: Never Used  Vaping Use  . Vaping Use: Never used  Substance and Sexual Activity  . Alcohol use: Yes    Comment: rare  . Drug use: No  . Sexual activity: Yes    Partners: Male    Birth control/protection: Implant  Other Topics Concern  . Not on file  Social History Narrative   Single with one 76 yo son.    Social Determinants of Health   Financial Resource Strain:   . Difficulty of Paying Living Expenses:   Food Insecurity:   . Worried About Programme researcher, broadcasting/film/video in the Last Year:   . Barista in the Last Year:   Transportation Needs:   . Freight forwarder (Medical):   Marland Kitchen Lack of Transportation (Non-Medical):   Physical Activity:   . Days of Exercise per Week:   . Minutes of Exercise per Session:   Stress:   . Feeling of Stress :  Social Connections:   . Frequency of Communication with Friends and Family:   . Frequency of Social Gatherings with Friends and Family:   . Attends Religious Services:   . Active Member of Clubs or Organizations:   . Attends Banker Meetings:   Marland Kitchen Marital Status:   Intimate Partner Violence:   . Fear of Current or Ex-Partner:   . Emotionally Abused:   Marland Kitchen Physically Abused:   . Sexually Abused:     ROS Review of Systems  Constitutional: Negative.   HENT: Negative.   Eyes: Negative.   Respiratory: Negative.   Cardiovascular: Negative.   Gastrointestinal: Negative.   Endocrine: Negative.  Negative for cold intolerance and heat intolerance.  Genitourinary: Positive for frequency. Negative for dysuria, hematuria and urgency.  Musculoskeletal: Negative.   Allergic/Immunologic:  Negative.   Neurological: Negative.   Hematological: Negative.   Psychiatric/Behavioral:       No concerns depression/anxiety PHQ-9: 5 and GAD-7: 2. She explains depression scores as not as problematic.     Objective:   Today's Vitals: BP 110/64 (BP Location: Left Arm, Patient Position: Sitting, Cuff Size: Normal)   Pulse 76   Temp 98.3 F (36.8 C) (Oral)   Ht 5\' 1"  (1.549 m)   Wt 217 lb (98.4 kg)   SpO2 98%   BMI 41.00 kg/m   Physical Exam Vitals reviewed.  Constitutional:      Appearance: Normal appearance.  Eyes:     Conjunctiva/sclera: Conjunctivae normal.     Pupils: Pupils are equal, round, and reactive to light.  Cardiovascular:     Rate and Rhythm: Normal rate and regular rhythm.     Pulses: Normal pulses.     Heart sounds: Normal heart sounds.  Pulmonary:     Effort: Pulmonary effort is normal.     Breath sounds: Normal breath sounds.  Abdominal:     Palpations: Abdomen is soft.     Tenderness: There is no abdominal tenderness.  Musculoskeletal:        General: Normal range of motion.     Cervical back: Normal range of motion and neck supple.  Skin:    General: Skin is warm and dry.  Neurological:     General: No focal deficit present.     Mental Status: She is alert and oriented to person, place, and time.  Psychiatric:        Mood and Affect: Mood normal.        Behavior: Behavior normal.     Assessment & Plan:   Problem List Items Addressed This Visit      Endocrine   Subclinical hypothyroidism   Relevant Orders   TSH (Completed)     Other   Preventative health care - Primary   Relevant Orders   CBC with Differential/Platelet (Completed)   Comprehensive metabolic panel (Completed)   B12 and Folate Panel (Completed)   Hepatitis C antibody (Completed)   HIV Antibody (routine testing w rflx) (Completed)   BMI 40.0-44.9, adult (HCC)   Relevant Orders   Amb Ref to Medical Weight Management   Hemoglobin A1c (Completed)   Lipid panel  (Completed)    Other Visit Diagnoses    Urinary frequency       Urinary urgency       Relevant Orders   Urinalysis, Routine w reflex microscopic (Completed)   Urinalysis   Need for Tdap vaccination       Relevant Orders   Tdap vaccine greater than or equal to  7yo IM (Completed)      Outpatient Encounter Medications as of 08/21/2019  Medication Sig  . [DISCONTINUED] Cholecalciferol (VITAMIN D3) 1000 units CAPS Take by mouth.  . [DISCONTINUED] Olopatadine HCl 0.2 % SOLN 1 drop to both eyes daily.  . [DISCONTINUED] Prenatal Multivit-Min-Fe-FA (PRENATAL 1 + IRON PO) Take by mouth.   Facility-Administered Encounter Medications as of 08/21/2019  Medication  . etonogestrel (NEXPLANON) implant 68 mg    Due for repeat Tdap will get this today.  Make appointment with your gynecologist to catch up with Pap test and discuss hormonal irregularity.  You are concerned about polycystic ovarian disease.  I made referral for her weight loss group.  Follow-up office visit in 6 months to check progress.  . Anticipatory guidance: Patient counseled regarding regular dental exams q6 months, eye exams yearly-. Wears contacts.   Risk factor reduction:  Advised patient of need for regular exercise and diet rich and fruits and vegetables to reduce risk of heart attack and stroke. Exercise- not exercising and recommend walking or other enjoyable aerobic exercise. Diet- needs improved- review info below.  Follow-up: Return in about 6 months (around 02/21/2020).   This visit occurred during the SARS-CoV-2 public health emergency.  Safety protocols were in place, including screening questions prior to the visit, additional usage of staff PPE, and extensive cleaning of exam room while observing appropriate contact time as indicated for disinfecting solutions.   Amedeo Kinsman, NP

## 2019-08-22 LAB — HEPATITIS C ANTIBODY
Hepatitis C Ab: NONREACTIVE
SIGNAL TO CUT-OFF: 0.01 (ref <1.00)

## 2019-08-22 LAB — HIV ANTIBODY (ROUTINE TESTING W REFLEX): HIV 1&2 Ab, 4th Generation: NONREACTIVE

## 2019-08-24 ENCOUNTER — Encounter: Payer: Self-pay | Admitting: Nurse Practitioner

## 2019-08-26 ENCOUNTER — Encounter: Payer: Self-pay | Admitting: Nurse Practitioner

## 2019-08-26 ENCOUNTER — Other Ambulatory Visit: Payer: Self-pay

## 2019-08-26 ENCOUNTER — Other Ambulatory Visit: Payer: 59

## 2019-08-26 DIAGNOSIS — R350 Frequency of micturition: Secondary | ICD-10-CM | POA: Diagnosis not present

## 2019-08-26 MED ORDER — CEFDINIR 300 MG PO CAPS: 300.0000 mg | ORAL_CAPSULE | Freq: Two times a day (BID) | ORAL | 0 refills | Status: AC

## 2019-08-26 NOTE — Telephone Encounter (Signed)
As discussed, she came in for urine culture and will begin Omnicef today. I will notify her when culture results are back.

## 2019-08-29 LAB — URINE CULTURE

## 2019-08-29 NOTE — Progress Notes (Signed)
What sensitivity are you looking for?

## 2019-09-02 DIAGNOSIS — Z23 Encounter for immunization: Secondary | ICD-10-CM | POA: Insufficient documentation

## 2019-09-05 ENCOUNTER — Encounter: Payer: 59 | Admitting: Nurse Practitioner

## 2019-10-15 ENCOUNTER — Encounter (INDEPENDENT_AMBULATORY_CARE_PROVIDER_SITE_OTHER): Payer: Self-pay | Admitting: Bariatrics

## 2019-10-15 ENCOUNTER — Ambulatory Visit (INDEPENDENT_AMBULATORY_CARE_PROVIDER_SITE_OTHER): Payer: 59 | Admitting: Bariatrics

## 2019-10-15 ENCOUNTER — Other Ambulatory Visit: Payer: Self-pay

## 2019-10-15 VITALS — BP 114/77 | HR 82 | Temp 98.6°F | Ht 62.0 in | Wt 217.0 lb

## 2019-10-15 DIAGNOSIS — E039 Hypothyroidism, unspecified: Secondary | ICD-10-CM | POA: Diagnosis not present

## 2019-10-15 DIAGNOSIS — R5383 Other fatigue: Secondary | ICD-10-CM

## 2019-10-15 DIAGNOSIS — Z1331 Encounter for screening for depression: Secondary | ICD-10-CM | POA: Diagnosis not present

## 2019-10-15 DIAGNOSIS — R7309 Other abnormal glucose: Secondary | ICD-10-CM | POA: Diagnosis not present

## 2019-10-15 DIAGNOSIS — R0602 Shortness of breath: Secondary | ICD-10-CM | POA: Diagnosis not present

## 2019-10-15 DIAGNOSIS — E559 Vitamin D deficiency, unspecified: Secondary | ICD-10-CM | POA: Diagnosis not present

## 2019-10-15 DIAGNOSIS — Z9189 Other specified personal risk factors, not elsewhere classified: Secondary | ICD-10-CM | POA: Diagnosis not present

## 2019-10-15 DIAGNOSIS — Z6839 Body mass index (BMI) 39.0-39.9, adult: Secondary | ICD-10-CM

## 2019-10-15 DIAGNOSIS — Z0289 Encounter for other administrative examinations: Secondary | ICD-10-CM

## 2019-10-15 DIAGNOSIS — E038 Other specified hypothyroidism: Secondary | ICD-10-CM

## 2019-10-15 NOTE — Progress Notes (Signed)
Dear Amedeo Kinsman, NP,   Thank you for referring Erin Tucker to our clinic. The following note includes my evaluation and treatment recommendations.  Chief Complaint:   OBESITY Erin Tucker (MR# [id number]) is a 34 y.o. female who presents for evaluation and treatment of obesity and related comorbidities. Current BMI is Body mass index is 39.69 kg/m.Marland Kitchen Erin Tucker has been struggling with her weight for many years and has been unsuccessful in either losing weight, maintaining weight loss, or reaching her healthy weight goal.  Erin Tucker is currently in the action stage of change and ready to dedicate time achieving and maintaining a healthier weight. Erin Tucker is interested in becoming our patient and working on intensive lifestyle modifications including (but not limited to) diet and exercise for weight loss.   Erin Tucker does like to cook sometimes. She craves "salty things."  Erin Tucker's habits were reviewed today and are as follows: Her family eats dinner together, she thinks her family will eat healthier with her, her desired weight loss is 72 lbs, she started gaining weight her senior year of high school, her heaviest weight ever was 220 pounds, she craves salty things (chips), sweets, and carbs, she skips breakfast most days and sometimes dinner, she frequently makes poor food choices, she frequently eats larger portions than normal, she has binge eating behaviors and she struggles with emotional eating.  Depression Screen Erin Tucker's Food and Mood (modified PHQ-9) score was 14.  Depression screen PHQ 2/9 10/15/2019  Decreased Interest 2  Down, Depressed, Hopeless 2  PHQ - 2 Score 4  Altered sleeping 3  Tired, decreased energy 3  Change in appetite 1  Feeling bad or failure about yourself  1  Trouble concentrating 2  Moving slowly or fidgety/restless 0  Suicidal thoughts 0  PHQ-9 Score 14  Difficult doing work/chores Somewhat difficult   Subjective:   Other fatigue. Erin Tucker admits to  daytime somnolence and admits to waking up still tired. Patent has a history of symptoms of daytime fatigue and Epworth sleepiness scale. Khamya generally gets 6-8 hours of sleep per night, and states that she does not sleep well most nights. Snoring is not present. Apneic episodes are not present. Epworth Sleepiness Score is 12.  SOB (shortness of breath) on exertion. Estel notes increasing shortness of breath with certain activities and seems to be worsening over time with weight gain. She notes getting out of breath sooner with activity than she used to. This has gotten worse recently. Dannell denies shortness of breath at rest or orthopnea.  Subclinical hypothyroidism. Erin Tucker had a thyroid nodule (cold). Last TSH level was normal at 1.72 on 08/21/2019.   Lab Results  Component Value Date   TSH 1.72 08/21/2019   Vitamin D deficiency. Erin Tucker is not taking Vitamin D supplementation.   Elevated glucose. Erin Tucker endorses decreased appetite. A1c 5.6.  Depression screening. Erin Tucker had a moderately positive depression screen with a PHQ-9 score of 14.  At risk for activity intolerance. Erin Tucker is at risk of exercise intolerance due to fatigue and shortness of breath.  Assessment/Plan:   Other fatigue. Avaline does feel that her weight is causing her energy to be lower than it should be. Fatigue may be related to obesity, depression or many other causes. Labs will be ordered, and in the meanwhile, Alton will focus on self care including making healthy food choices, increasing physical activity and focusing on stress reduction. EKG 12-Lead, Insulin, random, VITAMIN D 25 Hydroxy (Vit-D Deficiency, Fractures) testing ordered today.  SOB (  shortness of breath) on exertion. Guelda does feel that she gets out of breath more easily that she used to when she exercises. Kaley's shortness of breath appears to be obesity related and exercise induced. She has agreed to work on weight loss and gradually increase  exercise to treat her exercise induced shortness of breath. Will continue to monitor closely.  Subclinical hypothyroidism. Erin Tucker will follow-up with her PCP as scheduled.  Vitamin D deficiency. Low Vitamin D level contributes to fatigue and are associated with obesity, breast, and colon cancer. VITAMIN D 25 Hydroxy (Vit-D Deficiency, Fractures) level will be checked today.   Elevated glucose.  Insulin, random level will be checked today.   Depression screening. [place] had a positive depression screening. Depression is commonly associated with obesity and often results in emotional eating behaviors. We will monitor this closely and work on CBT to help improve the non-hunger eating patterns. Referral to Psychology may be required if no improvement is seen as she continues in our clinic.  At risk for activity intolerance. Erin Tucker was given approximately 15 minutes of exercise intolerance counseling today. She is 34 y.o. female and has risk factors exercise intolerance including obesity. We discussed intensive lifestyle modifications today with an emphasis on specific weight loss instructions and strategies. Erin Tucker will slowly increase activity as tolerated.  Erin Tucker was employed today to elicit superior memory formation and behavioral change.  Class 2 severe obesity with serious comorbidity and body mass index (BMI) of 39.0 to 39.9 in adult, unspecified obesity type (HCC).  Erin Tucker is currently in the action stage of change and her goal is to continue with weight loss efforts. I recommend Erin Tucker begin the structured treatment plan as follows:  She has agreed to the Category 1 Plan.  She will work on meal planning, intentional eating, and eliminating all sugary drinks.  We reviewed with the patient labs from 08/21/2019 including CMP, lipids, B12, CBC, A1c, and TSH.  Exercise goals: All adults should avoid inactivity. Some physical activity is better than none, and adults who  participate in any amount of physical activity gain some health benefits.   Behavioral modification strategies: increasing lean protein intake, decreasing simple carbohydrates, increasing vegetables, increasing water intake, decreasing eating out, no skipping meals, meal planning and cooking strategies, keeping healthy foods in the home and planning for success.  She was informed of the importance of frequent follow-up visits to maximize her success with intensive lifestyle modifications for her multiple health conditions. She was informed we would discuss her lab results at her next visit unless there is a critical issue that needs to be addressed sooner. Erin Tucker agreed to keep her next visit at the agreed upon time to discuss these results.  Objective:   Blood pressure 114/77, pulse 82, temperature 98.6 F (37 C), height 5\' 2"  (1.575 m), weight 217 lb (98.4 kg), last menstrual period 09/03/2019, SpO2 99 %. Body mass index is 39.69 kg/m.  EKG: Sinus  Rhythm with a rate of 81 BPM. Right bundle branch block. Otherwise normal.  Indirect Calorimeter completed today shows a VO2 of 209 and a REE of 1453.  Her calculated basal metabolic rate is 09/05/2019 thus her basal metabolic rate is worse than expected.  General: Cooperative, alert, well developed, in no acute distress. HEENT: Conjunctivae and lids unremarkable. Cardiovascular: Regular rhythm.  Lungs: Normal work of breathing. Neurologic: No focal deficits.   Lab Results  Component Value Date   CREATININE 1.05 08/21/2019   BUN 12 08/21/2019  NA 139 08/21/2019   K 4.0 08/21/2019   CL 104 08/21/2019   CO2 31 08/21/2019   Lab Results  Component Value Date   ALT 21 08/21/2019   AST 15 08/21/2019   ALKPHOS 74 08/21/2019   BILITOT 0.7 08/21/2019   Lab Results  Component Value Date   HGBA1C 5.6 08/21/2019   No results found for: INSULIN Lab Results  Component Value Date   TSH 1.72 08/21/2019   Lab Results  Component Value Date    CHOL 201 (H) 08/21/2019   HDL 49.60 08/21/2019   LDLCALC 121 (H) 08/21/2019   TRIG 152.0 (H) 08/21/2019   CHOLHDL 4 08/21/2019   Lab Results  Component Value Date   WBC 7.7 08/21/2019   HGB 12.4 08/21/2019   HCT 37.7 08/21/2019   MCV 85.5 08/21/2019   PLT 333.0 08/21/2019   No results found for: IRON, TIBC, FERRITIN  Attestation Statements:   Reviewed by clinician on day of visit: allergies, medications, problem list, medical history, surgical history, family history, social history, and previous encounter notes.  Fernanda Drum, am acting as Energy manager for [employer], DO   I have reviewed the above documentation for accuracy and completeness, and I agree with the above. Corinna Capra, DO

## 2019-10-16 ENCOUNTER — Encounter (INDEPENDENT_AMBULATORY_CARE_PROVIDER_SITE_OTHER): Payer: Self-pay | Admitting: Bariatrics

## 2019-10-16 DIAGNOSIS — E559 Vitamin D deficiency, unspecified: Secondary | ICD-10-CM | POA: Insufficient documentation

## 2019-10-16 LAB — INSULIN, RANDOM: INSULIN: 18.3 u[IU]/mL (ref 2.6–24.9)

## 2019-10-16 LAB — VITAMIN D 25 HYDROXY (VIT D DEFICIENCY, FRACTURES): Vit D, 25-Hydroxy: 18.8 ng/mL — ABNORMAL LOW (ref 30.0–100.0)

## 2019-10-29 ENCOUNTER — Ambulatory Visit (INDEPENDENT_AMBULATORY_CARE_PROVIDER_SITE_OTHER): Payer: 59 | Admitting: Bariatrics

## 2019-10-29 ENCOUNTER — Other Ambulatory Visit: Payer: Self-pay

## 2019-10-29 VITALS — BP 113/78 | HR 77 | Temp 98.4°F | Ht 62.0 in | Wt 215.0 lb

## 2019-10-29 DIAGNOSIS — Z9189 Other specified personal risk factors, not elsewhere classified: Secondary | ICD-10-CM

## 2019-10-29 DIAGNOSIS — E559 Vitamin D deficiency, unspecified: Secondary | ICD-10-CM

## 2019-10-29 DIAGNOSIS — E8881 Metabolic syndrome: Secondary | ICD-10-CM

## 2019-10-29 DIAGNOSIS — Z6839 Body mass index (BMI) 39.0-39.9, adult: Secondary | ICD-10-CM

## 2019-10-29 MED ORDER — VITAMIN D (ERGOCALCIFEROL) 1.25 MG (50000 UNIT) PO CAPS: 50000.0000 [IU] | ORAL_CAPSULE | ORAL | 0 refills | Status: DC

## 2019-10-29 MED ORDER — METFORMIN HCL 500 MG PO TABS: 500.0000 mg | ORAL_TABLET | Freq: Every day | ORAL | 0 refills | Status: DC

## 2019-11-03 ENCOUNTER — Encounter (INDEPENDENT_AMBULATORY_CARE_PROVIDER_SITE_OTHER): Payer: Self-pay | Admitting: Bariatrics

## 2019-11-03 NOTE — Progress Notes (Signed)
Chief Complaint:   OBESITY Erin Tucker is here to discuss her progress with her obesity treatment plan along with follow-up of her obesity related diagnoses. Erin Tucker is on the Category 1 Plan and states she is following her eating plan approximately 75% of the time. Erin Tucker states she is exercising for 0 minutes 0 times per week.  Today's visit was #: 2 Starting weight: 217 lbs Starting date: 10/15/2019 Today's weight: 215 lbs Today's date: 10/29/2019 Total lbs lost to date: 2 lbs Total lbs lost since last in-office visit: 2 lbs  Interim History: Erin Tucker is down 2 pounds.  She ate most of the food.  She has done better with her water intake.  Subjective:   1. Vitamin D deficiency Erin Tucker's Vitamin D level was 18.8 on 10/15/2019. She is currently taking no vitamin D supplement. She denies nausea, vomiting or muscle weakness.  2. Insulin resistance Erin Tucker has a diagnosis of insulin resistance based on her elevated fasting insulin level >5. She continues to work on diet and exercise to decrease her risk of diabetes.  Erin Tucker endorses polyphagia.  Lab Results  Component Value Date   INSULIN 18.3 10/15/2019   Lab Results  Component Value Date   HGBA1C 5.6 08/21/2019   3. At risk of diabetes mellitus Erin Tucker is at higher than average risk for developing diabetes due to her obesity and insulin resistance diagnosis.   Assessment/Plan:   1. Vitamin D deficiency Low Vitamin D level contributes to fatigue and are associated with obesity, breast, and colon cancer. She agrees to start to take prescription Vitamin D @50 ,000 IU every week and will follow-up for routine testing of Vitamin D, at least 2-3 times per year to avoid over-replacement.  Rx has been sent to the patient's pharmacy for high dose vitamin D.  2. Insulin resistance Erin Tucker will continue to work on weight loss, exercise, and decreasing simple carbohydrates to help decrease the risk of diabetes. Erin Tucker agreed to follow-up with as  directed to closely monitor her progress.  Will start metformin 500 mg 1 tablet at lunch.  Rx has been sent to her pharmacy.  3. At risk of diabetes mellitus Erin Tucker was given approximately 15 minutes of diabetes education and counseling today. We discussed intensive lifestyle modifications today with an emphasis on weight loss as well as increasing exercise and decreasing simple carbohydrates in her diet. We also reviewed medication options with an emphasis on risk versus benefit of those discussed.   Repetitive spaced learning was employed today to elicit superior memory formation and behavioral change.  4. Class 2 severe obesity with serious comorbidity and body mass index (BMI) of 39.0 to 39.9 in adult, unspecified obesity type (Erin Tucker) Erin Tucker is currently in the action stage of change. As such, her goal is to continue with weight loss efforts. She has agreed to the Category 1 Plan.   She will work on meal planning and will pack her lunch.  Labs from 10/15/2019 were reviewed independently and with the patient and included vitamin D and insulin.  Exercise goals: Cardio.  Behavioral modification strategies: increasing lean protein intake, decreasing simple carbohydrates, increasing vegetables, increasing water intake, decreasing eating out, no skipping meals, meal planning and cooking strategies, keeping healthy foods in the home and planning for success.  Tressie has agreed to follow-up with our clinic in 2 weeks. She was informed of the importance of frequent follow-up visits to maximize her success with intensive lifestyle modifications for her multiple health conditions.   Objective:  Blood pressure 113/78, pulse 77, temperature 98.4 F (36.9 C), height 5\' 2"  (1.575 m), weight 215 lb (97.5 kg), SpO2 98 %. Body mass index is 39.32 kg/m.  General: Cooperative, alert, well developed, in no acute distress. HEENT: Conjunctivae and lids unremarkable. Cardiovascular: Regular rhythm.  Lungs:  Normal work of breathing. Neurologic: No focal deficits.   Lab Results  Component Value Date   CREATININE 1.05 08/21/2019   BUN 12 08/21/2019   NA 139 08/21/2019   K 4.0 08/21/2019   CL 104 08/21/2019   CO2 31 08/21/2019   Lab Results  Component Value Date   ALT 21 08/21/2019   AST 15 08/21/2019   ALKPHOS 74 08/21/2019   BILITOT 0.7 08/21/2019   Lab Results  Component Value Date   HGBA1C 5.6 08/21/2019   Lab Results  Component Value Date   INSULIN 18.3 10/15/2019   Lab Results  Component Value Date   TSH 1.72 08/21/2019   Lab Results  Component Value Date   CHOL 201 (H) 08/21/2019   HDL 49.60 08/21/2019   LDLCALC 121 (H) 08/21/2019   TRIG 152.0 (H) 08/21/2019   CHOLHDL 4 08/21/2019   Lab Results  Component Value Date   WBC 7.7 08/21/2019   HGB 12.4 08/21/2019   HCT 37.7 08/21/2019   MCV 85.5 08/21/2019   PLT 333.0 08/21/2019   Attestation Statements:   Reviewed by clinician on day of visit: allergies, medications, problem list, medical history, surgical history, family history, social history, and previous encounter notes.  I, 10/22/2019, CMA, am acting as Insurance claims handler for Energy manager, DO  I have reviewed the above documentation for accuracy and completeness, and I agree with the above. [employer], DO

## 2019-11-12 ENCOUNTER — Encounter (INDEPENDENT_AMBULATORY_CARE_PROVIDER_SITE_OTHER): Payer: Self-pay | Admitting: Family Medicine

## 2019-11-12 ENCOUNTER — Encounter: Payer: Self-pay | Admitting: Nurse Practitioner

## 2019-11-12 ENCOUNTER — Other Ambulatory Visit: Payer: Self-pay

## 2019-11-12 ENCOUNTER — Ambulatory Visit (INDEPENDENT_AMBULATORY_CARE_PROVIDER_SITE_OTHER): Payer: 59 | Admitting: Family Medicine

## 2019-11-12 VITALS — BP 107/72 | HR 63 | Temp 98.2°F | Ht 62.0 in | Wt 215.0 lb

## 2019-11-12 DIAGNOSIS — E559 Vitamin D deficiency, unspecified: Secondary | ICD-10-CM | POA: Diagnosis not present

## 2019-11-12 DIAGNOSIS — E8881 Metabolic syndrome: Secondary | ICD-10-CM | POA: Diagnosis not present

## 2019-11-12 DIAGNOSIS — Z9189 Other specified personal risk factors, not elsewhere classified: Secondary | ICD-10-CM | POA: Diagnosis not present

## 2019-11-12 DIAGNOSIS — Z6839 Body mass index (BMI) 39.0-39.9, adult: Secondary | ICD-10-CM | POA: Diagnosis not present

## 2019-11-12 DIAGNOSIS — E88819 Insulin resistance, unspecified: Secondary | ICD-10-CM

## 2019-11-12 MED ORDER — WEGOVY 0.25 MG/0.5ML ~~LOC~~ SOAJ: 0.2500 mg | SUBCUTANEOUS | 0 refills | Status: DC

## 2019-11-12 MED ORDER — VITAMIN D (ERGOCALCIFEROL) 1.25 MG (50000 UNIT) PO CAPS: 50000.0000 [IU] | ORAL_CAPSULE | ORAL | 0 refills | Status: DC

## 2019-11-12 NOTE — Progress Notes (Signed)
Chief Complaint:   OBESITY Erin Tucker is here to discuss her progress with her obesity treatment plan along with follow-up of her obesity related diagnoses. Erin Tucker is on the Category 1 Plan and states she is following her eating plan approximately 75% of the time. Aquanetta states she is doing 0 minutes 0 times per week.  Today's visit was #: 3 Starting weight: 217 lbs Starting date: 10/15/2019 Today's weight: 215 lbs Today's date: 11/12/2019 Total lbs lost to date: 2 Total lbs lost since last in-office visit: 0  Interim History: Welda does well with plan at home during the week but struggles with snacking at work on the weekends. 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 There are snacks available in the surgeon's lounge at work. She is eating all of the prescribed protein. She is doing better with water intake (48 oz per day). She tends to overeat extra calories.  Subjective:   1. Vitamin D deficiency Erin Tucker's last Vit D level was low at 18.8. She is on weekly prescription Vit D.   2. Insulin resistance Erin Tucker was started on metformin at her last office visit. She feels it helps slightly with appetite but she still snacks even though she is not hungry.  3. At risk for side effect of medication Jamica is at risk for drug side effects due to start of Wegovy (Nausea, constipation).  Assessment/Plan:   1. Vitamin D deficiency Refill prescription Vitamin D for 1 month.   - Vitamin D, Ergocalciferol, (DRISDOL) 1.25 MG (50000 UNIT) CAPS capsule; Take 1 capsule (50,000 Units total) by mouth every 7 (seven) days.  Dispense: 4 capsule; Refill: 0  2. Insulin resistance Lenise will continue metformin.  3. At risk for side effect of medication Erin Tucker was given approximately 15 minutes of drug side effect counseling today.  She was educated about SE of Wegovy (Nausea, constipation). We discussed side effect possibility and risk versus benefits. Erin Tucker agreed to the medication and will  contact this office if these side effects are intolerable.  Repetitive spaced learning was employed today to elicit superior memory formation and behavioral change.  4. Class 2 severe obesity with serious comorbidity and body mass index (BMI) of 39.0 to 39.9 in adult, unspecified obesity type (HCC) Erin Tucker is currently in the action stage of change. As such, her goal is to continue with weight loss efforts. She has agreed to the Category 1 Plan.   We discussed various medication options to help Erin Tucker with her weight loss efforts and we both agreed to start Wegovy 0.25 mg SubQ weekly with no refill. Erin Tucker denies personal or family history of thyroid cancer, history of pancreatitis, or current cholelithiasis.  - Semaglutide-Weight Management (WEGOVY) 0.25 MG/0.5ML SOAJ; Inject 0.25 mg into the skin once a week.  Dispense: 2 mL; Refill: 0  Exercise goals: No exercise has been prescribed at this time.  Behavioral modification strategies: decreasing simple carbohydrates and planning for success.  Erin Tucker has agreed to follow-up with our clinic in 2 weeks.  Objective:   Blood pressure 107/72, pulse 63, temperature 98.2 F (36.8 C), temperature source Oral, height 5\' 2"  (1.575 m), weight 215 lb (97.5 kg), SpO2 98 %. Body mass index is 39.32 kg/m.  General: Cooperative, alert, well developed, in no acute distress. HEENT: Conjunctivae and lids unremarkable. Cardiovascular: Regular rhythm.  Lungs: Normal work of breathing. Neurologic: No focal deficits.   Lab Results  Component Value Date   CREATININE 1.05 08/21/2019   BUN  12 08/21/2019   NA 139 08/21/2019   K 4.0 08/21/2019   CL 104 08/21/2019   CO2 31 08/21/2019   Lab Results  Component Value Date   ALT 21 08/21/2019   AST 15 08/21/2019   ALKPHOS 74 08/21/2019   BILITOT 0.7 08/21/2019   Lab Results  Component Value Date   HGBA1C 5.6 08/21/2019   Lab Results  Component Value Date   INSULIN 18.3 10/15/2019   Lab Results    Component Value Date   TSH 1.72 08/21/2019   Lab Results  Component Value Date   CHOL 201 (H) 08/21/2019   HDL 49.60 08/21/2019   LDLCALC 121 (H) 08/21/2019   TRIG 152.0 (H) 08/21/2019   CHOLHDL 4 08/21/2019   Lab Results  Component Value Date   WBC 7.7 08/21/2019   HGB 12.4 08/21/2019   HCT 37.7 08/21/2019   MCV 85.5 08/21/2019   PLT 333.0 08/21/2019   No results found for: IRON, TIBC, FERRITIN  Attestation Statements:   Reviewed by clinician on day of visit: allergies, medications, problem list, medical history, surgical history, family history, social history, and previous encounter notes.   Trude Mcburney, am acting as Energy manager for [place], FNP-C.  I have reviewed the above documentation for accuracy and completeness, and I agree with the above. -  Jesse Sans, FNP

## 2019-11-13 ENCOUNTER — Ambulatory Visit (INDEPENDENT_AMBULATORY_CARE_PROVIDER_SITE_OTHER): Payer: 59 | Admitting: Family Medicine

## 2019-11-13 ENCOUNTER — Encounter (INDEPENDENT_AMBULATORY_CARE_PROVIDER_SITE_OTHER): Payer: Self-pay | Admitting: Family Medicine

## 2019-11-13 DIAGNOSIS — E88819 Insulin resistance, unspecified: Secondary | ICD-10-CM | POA: Insufficient documentation

## 2019-11-13 DIAGNOSIS — E8881 Metabolic syndrome: Secondary | ICD-10-CM | POA: Insufficient documentation

## 2019-11-24 ENCOUNTER — Encounter (INDEPENDENT_AMBULATORY_CARE_PROVIDER_SITE_OTHER): Payer: Self-pay | Admitting: Family Medicine

## 2019-11-24 DIAGNOSIS — Z6839 Body mass index (BMI) 39.0-39.9, adult: Secondary | ICD-10-CM

## 2019-11-25 MED ORDER — BD PEN NEEDLE NANO 2ND GEN 32G X 4 MM MISC: 0 refills | Status: DC

## 2019-11-25 MED ORDER — SAXENDA 18 MG/3ML ~~LOC~~ SOPN: 3.0000 mg | PEN_INJECTOR | Freq: Every day | SUBCUTANEOUS | 0 refills | Status: DC

## 2019-12-01 ENCOUNTER — Other Ambulatory Visit: Payer: Self-pay

## 2019-12-01 ENCOUNTER — Ambulatory Visit (INDEPENDENT_AMBULATORY_CARE_PROVIDER_SITE_OTHER): Payer: 59 | Admitting: Bariatrics

## 2019-12-01 ENCOUNTER — Encounter (INDEPENDENT_AMBULATORY_CARE_PROVIDER_SITE_OTHER): Payer: Self-pay | Admitting: Bariatrics

## 2019-12-01 VITALS — BP 132/76 | HR 65 | Temp 98.5°F | Ht 62.0 in | Wt 214.0 lb

## 2019-12-01 DIAGNOSIS — Z6839 Body mass index (BMI) 39.0-39.9, adult: Secondary | ICD-10-CM

## 2019-12-01 DIAGNOSIS — Z9189 Other specified personal risk factors, not elsewhere classified: Secondary | ICD-10-CM | POA: Diagnosis not present

## 2019-12-01 DIAGNOSIS — E66812 Obesity, class 2: Secondary | ICD-10-CM

## 2019-12-01 DIAGNOSIS — E8881 Metabolic syndrome: Secondary | ICD-10-CM

## 2019-12-01 DIAGNOSIS — E559 Vitamin D deficiency, unspecified: Secondary | ICD-10-CM | POA: Diagnosis not present

## 2019-12-01 DIAGNOSIS — E88819 Insulin resistance, unspecified: Secondary | ICD-10-CM

## 2019-12-01 MED ORDER — METFORMIN HCL 500 MG PO TABS: 500.0000 mg | ORAL_TABLET | Freq: Every day | ORAL | 0 refills | Status: DC

## 2019-12-03 ENCOUNTER — Encounter (INDEPENDENT_AMBULATORY_CARE_PROVIDER_SITE_OTHER): Payer: Self-pay | Admitting: Bariatrics

## 2019-12-03 NOTE — Progress Notes (Signed)
Chief Complaint:   OBESITY Erin Tucker is here to discuss her progress with her obesity treatment plan along with follow-up of her obesity related diagnoses. Erin Tucker is on the Category 1 Plan and states she is following her eating plan approximately 25% of the time. Erin Tucker states she is exercising 0 minutes 0 times per week.  Today's visit was #: 4 Starting weight: 217 lbs Starting date: 10/15/2019 Today's weight: 214 lbs Today's date: 12/01/2019 Total lbs lost to date: 3 Total lbs lost since last in-office visit: 1  Interim History: Erin Tucker is down 1 lb since her last visit.  She states that she is taking her metformin, but not always consistently. She reports doing well with her protein.  Subjective:   Insulin resistance. Erin Tucker has a diagnosis of insulin resistance based on her elevated fasting insulin level >5. She continues to work on diet and exercise to decrease her risk of diabetes. Erin Tucker is taking metformin.  Lab Results  Component Value Date   INSULIN 18.3 10/15/2019   Lab Results  Component Value Date   HGBA1C 5.6 08/21/2019   Vitamin D deficiency. Erin Tucker is taking Vitamin D supplementation.    Ref. Range 10/15/2019 09:26  Vitamin D, 25-Hydroxy Latest Ref Range: 30.0 - 100.0 ng/mL 18.8 (L)   At risk for hypoglycemia. Erin Tucker is at increased risk for hypoglycemia due to insulin resistance.  Assessment/Plan:   Insulin resistance. Erin Tucker will continue to work on weight loss, exercise, and decreasing simple carbohydrates to help decrease the risk of diabetes. Erin Tucker agreed to follow-up with [place] as directed to closely monitor her progress. Prescription was given for metFORMIN (GLUCOPHAGE) 500 MG tablet 1 PO with lunch #30 with 0 refills.  Vitamin D deficiency. Low Vitamin D level contributes to fatigue and are associated with obesity, breast, and colon cancer. She agrees to continue to take Vitamin D as directed and will follow-up for routine testing of Vitamin D,  at least 2-3 times per year to avoid over-replacement.  At risk for hypoglycemia. Erin Tucker was given approximately 15 minutes of counseling today regarding prevention of hypoglycemia. She was advised of symptoms of hypoglycemia. Erin Tucker was instructed to avoid skipping meals, eat regular protein rich meals and schedule low calorie snacks as needed.   Repetitive spaced learning was employed today to elicit superior memory formation and behavioral change.  Class 2 severe obesity with serious comorbidity and body mass index (BMI) of 39.0 to 39.9 in adult, unspecified obesity type (HCC).  Erin Tucker is currently in the action stage of change. As such, her goal is to continue with weight loss efforts. She has agreed to the Category 1 Plan.   She will work on meal planning and increasing her water intake.  Exercise goals: Erin Tucker will increase activity (workout).  Behavioral modification strategies: increasing lean protein intake, decreasing simple carbohydrates, increasing vegetables, increasing water intake, decreasing eating out, no skipping meals, meal planning and cooking strategies, keeping healthy foods in the home and planning for success.  Erin Tucker has agreed to follow-up with our clinic in 2-3 weeks. She was informed of the importance of frequent follow-up visits to maximize her success with intensive lifestyle modifications for her multiple health conditions.   Objective:   Blood pressure 132/76, pulse 65, temperature 98.5 F (36.9 C), height 5\' 2"  (1.575 m), weight 214 lb (97.1 kg), SpO2 100 %. Body mass index is 39.14 kg/m.  General: Cooperative, alert, well developed, in no acute distress. HEENT: Conjunctivae and lids unremarkable. Cardiovascular: Regular  rhythm.  Lungs: Normal work of breathing. Neurologic: No focal deficits.   Lab Results  Component Value Date   CREATININE 1.05 08/21/2019   BUN 12 08/21/2019   NA 139 08/21/2019   K 4.0 08/21/2019   CL 104 08/21/2019   CO2 31  08/21/2019   Lab Results  Component Value Date   ALT 21 08/21/2019   AST 15 08/21/2019   ALKPHOS 74 08/21/2019   BILITOT 0.7 08/21/2019   Lab Results  Component Value Date   HGBA1C 5.6 08/21/2019   Lab Results  Component Value Date   INSULIN 18.3 10/15/2019   Lab Results  Component Value Date   TSH 1.72 08/21/2019   Lab Results  Component Value Date   CHOL 201 (H) 08/21/2019   HDL 49.60 08/21/2019   LDLCALC 121 (H) 08/21/2019   TRIG 152.0 (H) 08/21/2019   CHOLHDL 4 08/21/2019   Lab Results  Component Value Date   WBC 7.7 08/21/2019   HGB 12.4 08/21/2019   HCT 37.7 08/21/2019   MCV 85.5 08/21/2019   PLT 333.0 08/21/2019   No results found for: IRON, TIBC, FERRITIN  Attestation Statements:   Reviewed by clinician on day of visit: allergies, medications, problem list, medical history, surgical history, family history, social history, and previous encounter notes.  Fernanda Drum, am acting as Energy manager for [employer], DO   I have reviewed the above documentation for accuracy and completeness, and I agree with the above. Corinna Capra, DO

## 2019-12-09 ENCOUNTER — Encounter (INDEPENDENT_AMBULATORY_CARE_PROVIDER_SITE_OTHER): Payer: Self-pay

## 2019-12-17 ENCOUNTER — Other Ambulatory Visit: Payer: Self-pay

## 2019-12-17 ENCOUNTER — Ambulatory Visit (INDEPENDENT_AMBULATORY_CARE_PROVIDER_SITE_OTHER): Payer: 59 | Admitting: Family Medicine

## 2019-12-17 ENCOUNTER — Encounter (INDEPENDENT_AMBULATORY_CARE_PROVIDER_SITE_OTHER): Payer: Self-pay | Admitting: Family Medicine

## 2019-12-17 VITALS — BP 109/69 | HR 71 | Temp 98.3°F | Ht 62.0 in | Wt 213.0 lb

## 2019-12-17 DIAGNOSIS — Z9189 Other specified personal risk factors, not elsewhere classified: Secondary | ICD-10-CM | POA: Diagnosis not present

## 2019-12-17 DIAGNOSIS — F3289 Other specified depressive episodes: Secondary | ICD-10-CM | POA: Diagnosis not present

## 2019-12-17 DIAGNOSIS — Z6839 Body mass index (BMI) 39.0-39.9, adult: Secondary | ICD-10-CM

## 2019-12-17 DIAGNOSIS — E559 Vitamin D deficiency, unspecified: Secondary | ICD-10-CM | POA: Diagnosis not present

## 2019-12-17 MED ORDER — WEGOVY 0.25 MG/0.5ML ~~LOC~~ SOAJ: 0.2500 mg | SUBCUTANEOUS | 0 refills | Status: DC

## 2019-12-17 NOTE — Progress Notes (Signed)
Chief Complaint:   OBESITY Erin Tucker is here to discuss her progress with her obesity treatment plan along with follow-up of her obesity related diagnoses. Erin Tucker is on the Category 1 Plan and states she is following her eating plan approximately 25% of the time. Erin Tucker states she is doing 0 minutes 0 times per week.  Today's visit was #: 5 Starting weight: 217 lbs Starting date: 10/15/2019 Today's weight: 213 lbs Today's date: 12/17/2019 Total lbs lost to date: 4 Total lbs lost since last in-office visit: 1  Interim History: Erin Tucker feels it is harder to stick to the plan on workdays. She works  weekend option. She tends to go over on extra calories. She is snacking less at work but it is still a struggle due to the availability of free food such as cookies and peanut butter. She does not feel journaling would work well for her because she does not think she would keep up with it well. She has not yet started Erin Tucker. We just obtained PA for [facility]  Subjective:   1. Vitamin D deficiency Erin Tucker's last Vit D level was low at 18.8. She is on prescription Vit D.  2. Other depression with emotional eating Erin Tucker notes eating due to boredom at times.  3. At risk for side effect of medication Erin Tucker is at risk for drug side effects due to start of Wegovy.  Assessment/Plan:   1. Vitamin D deficiency Low Vitamin D level contributes to fatigue and are associated with obesity, breast, and colon cancer. We will refill prescription Vitamin D for 1 month. Erin Tucker will follow-up for routine testing of Vitamin D, at least 2-3 times per year to avoid over-replacement.  2. Other depression with emotional eating Behavior modification techniques were discussed today to help Erin Tucker deal with her emotional/non-hunger eating behaviors. Erin Tucker will start Wegovy. Orders and follow up as documented in patient record.   3. At risk for side effect of medication Erin Tucker was given approximately 15  minutes of drug side effect counseling today.  We discussed side effect possibility and risk versus benefits of starting Wegovy. Erin Tucker agreed to the medication and will contact this office if these side effects are intolerable.  Repetitive spaced learning was employed today to elicit superior memory formation and behavioral change.  4. Class 2 severe obesity with serious comorbidity and body mass index (BMI) of 39.0 to 39.9 in adult, unspecified obesity type (HCC) Erin Tucker is currently in the action stage of change. As such, her goal is to continue with weight loss efforts. She has agreed to the Category 1 Plan.   We discussed various medication options to help Erin Tucker with her weight loss efforts and we both agreed to start Wegovy 0.25 mg SubQ weekly with no refills.  - Semaglutide-Weight Management (WEGOVY) 0.25 MG/0.5ML SOAJ; Inject 0.25 mg into the skin once a week.  Dispense: 2 mL; Refill: 0  Exercise goals: No exercise has been prescribed at this time.  Behavioral modification strategies: decreasing simple carbohydrates, meal planning and cooking strategies and ways to avoid boredom eating.  Erin Tucker has agreed to follow-up with our clinic in 2 weeks.   Objective:   Blood pressure 109/69, pulse 71, temperature 98.3 F (36.8 C), height 5\' 2"  (1.575 m), weight 213 lb (96.6 kg), SpO2 99 %. Body mass index is 38.96 kg/m.  General: Cooperative, alert, well developed, in no acute distress. HEENT: Conjunctivae and lids unremarkable. Cardiovascular: Regular rhythm.  Lungs: Normal work of breathing. Neurologic: No  focal deficits.   Lab Results  Component Value Date   CREATININE 1.05 08/21/2019   BUN 12 08/21/2019   NA 139 08/21/2019   K 4.0 08/21/2019   CL 104 08/21/2019   CO2 31 08/21/2019   Lab Results  Component Value Date   ALT 21 08/21/2019   AST 15 08/21/2019   ALKPHOS 74 08/21/2019   BILITOT 0.7 08/21/2019   Lab Results  Component Value Date   HGBA1C 5.6 08/21/2019    Lab Results  Component Value Date   INSULIN 18.3 10/15/2019   Lab Results  Component Value Date   TSH 1.72 08/21/2019   Lab Results  Component Value Date   CHOL 201 (H) 08/21/2019   HDL 49.60 08/21/2019   LDLCALC 121 (H) 08/21/2019   TRIG 152.0 (H) 08/21/2019   CHOLHDL 4 08/21/2019   Lab Results  Component Value Date   WBC 7.7 08/21/2019   HGB 12.4 08/21/2019   HCT 37.7 08/21/2019   MCV 85.5 08/21/2019   PLT 333.0 08/21/2019   No results found for: IRON, TIBC, FERRITIN  Attestation Statements:   Reviewed by clinician on day of visit: allergies, medications, problem list, medical history, surgical history, family history, social history, and previous encounter notes.   Trude Mcburney, am acting as Energy manager for [place], FNP-C.  I have reviewed the above documentation for accuracy and completeness, and I agree with the above. -  Jesse Sans, FNP

## 2019-12-18 ENCOUNTER — Encounter (INDEPENDENT_AMBULATORY_CARE_PROVIDER_SITE_OTHER): Payer: Self-pay | Admitting: Family Medicine

## 2019-12-18 NOTE — Telephone Encounter (Signed)
Erin Tucker pt °

## 2019-12-21 ENCOUNTER — Other Ambulatory Visit (INDEPENDENT_AMBULATORY_CARE_PROVIDER_SITE_OTHER): Payer: Self-pay | Admitting: Family Medicine

## 2019-12-21 DIAGNOSIS — E559 Vitamin D deficiency, unspecified: Secondary | ICD-10-CM

## 2019-12-22 NOTE — Telephone Encounter (Signed)
Last seen by Dawn  

## 2019-12-23 ENCOUNTER — Other Ambulatory Visit (INDEPENDENT_AMBULATORY_CARE_PROVIDER_SITE_OTHER): Payer: Self-pay | Admitting: Bariatrics

## 2019-12-23 DIAGNOSIS — E8881 Metabolic syndrome: Secondary | ICD-10-CM

## 2019-12-23 NOTE — Telephone Encounter (Signed)
Erin Tucker pt °

## 2019-12-24 MED ORDER — METFORMIN HCL 500 MG PO TABS: 500.0000 mg | ORAL_TABLET | Freq: Every day | ORAL | 0 refills | Status: DC

## 2019-12-24 NOTE — Telephone Encounter (Signed)
RX refill sent per [facility].

## 2019-12-24 NOTE — Telephone Encounter (Signed)
Pt seen yesterday please advise.

## 2019-12-31 ENCOUNTER — Other Ambulatory Visit: Payer: Self-pay

## 2019-12-31 ENCOUNTER — Ambulatory Visit (INDEPENDENT_AMBULATORY_CARE_PROVIDER_SITE_OTHER): Payer: 59 | Admitting: Family Medicine

## 2019-12-31 ENCOUNTER — Encounter (INDEPENDENT_AMBULATORY_CARE_PROVIDER_SITE_OTHER): Payer: Self-pay | Admitting: Family Medicine

## 2019-12-31 VITALS — BP 115/72 | HR 87 | Temp 98.9°F | Ht 62.0 in | Wt 212.0 lb

## 2019-12-31 DIAGNOSIS — E8881 Metabolic syndrome: Secondary | ICD-10-CM | POA: Diagnosis not present

## 2019-12-31 DIAGNOSIS — Z6838 Body mass index (BMI) 38.0-38.9, adult: Secondary | ICD-10-CM

## 2019-12-31 DIAGNOSIS — E559 Vitamin D deficiency, unspecified: Secondary | ICD-10-CM | POA: Diagnosis not present

## 2019-12-31 DIAGNOSIS — E661 Drug-induced obesity: Secondary | ICD-10-CM | POA: Diagnosis not present

## 2019-12-31 DIAGNOSIS — E88819 Insulin resistance, unspecified: Secondary | ICD-10-CM

## 2019-12-31 MED ORDER — WEGOVY 0.25 MG/0.5ML ~~LOC~~ SOAJ: 0.2500 mg | SUBCUTANEOUS | 0 refills | Status: DC

## 2020-01-01 NOTE — Progress Notes (Signed)
Chief Complaint:   OBESITY Erin Tucker is here to discuss her progress with her obesity treatment plan along with follow-up of her obesity related diagnoses. Erin Tucker is on the Category 1 Plan and states she is following her eating plan approximately 75% of the time. [name] states she is doing cardio for 30 minutes 3 times per week.  Today's visit was #: 6 Starting weight: 217 lbs Starting date: 10/15/2019 Today's weight: 212 lbs Today's date: 12/31/2019 Total lbs lost to date: 5 Total lbs lost since last in-office visit: 1  Interim History: [name] was started on Wegovy at her last office visit, and she is tolerating it well except for mild nausea. She notes she has the abolity to say "no" to tempting foods at work. She is getting in all of her protein.  Subjective:   1. Insulin resistance  Her hunger and cravings are well controlled.   Lab Results  Component Value Date   INSULIN 18.3 10/15/2019   Lab Results  Component Value Date   HGBA1C 5.6 08/21/2019   2. Vitamin D deficiency Erin Tucker's last Vit D level was at 18.8. She is on weekly prescription Vit D.  Assessment/Plan:   1. Insulin resistance  Preslyn agreed to discontinue metformin, and she will continue Wegovy.   2. Vitamin D deficiency  [name] agreed to continue taking prescription Vitamin D 50,000 IU every week, no refill needed. She will follow-up for routine testing of Vitamin D, at least 2-3 times per year to avoid over-replacement.  3. Class 2 drug-induced obesity with serious comorbidity and body mass index (BMI) of 38.0 to 38.9 in adult Erin Tucker is currently in the action stage of change. As such, her goal is to continue with weight loss efforts. She has agreed to the Category 1 Plan and keeping a food journal and adhering to recommended goals of 300-400 calories and 35 grams of protein at supper daily.   Erin Tucker may have a protein shake for breakfast. Handouts were giving today: Journaling, and Recipes.  We  discussed various medication options to help Erin Tucker with her weight loss efforts and we both agreed to continue Wegovy (0.25 mg), and we will refill for 1 month.  - Semaglutide-Weight Management (WEGOVY) 0.25 MG/0.5ML SOAJ; Inject 0.25 mg into the skin once a week.  Dispense: 2 mL; Refill: 0  Exercise goals: As is.  Behavioral modification strategies: increasing lean protein intake and planning for success.  Erin Tucker has agreed to follow-up with our clinic in 2 weeks.   Objective:   Blood pressure 115/72, pulse 87, temperature 98.9 F (37.2 C), height 5\' 2"  (1.575 m), weight 212 lb (96.2 kg), SpO2 99 %. Body mass index is 38.78 kg/m.  General: Cooperative, alert, well developed, in no acute distress. HEENT: Conjunctivae and lids unremarkable. Cardiovascular: Regular rhythm.  Lungs: Normal work of breathing. Neurologic: No focal deficits.   Lab Results  Component Value Date   CREATININE 1.05 08/21/2019   BUN 12 08/21/2019   NA 139 08/21/2019   K 4.0 08/21/2019   CL 104 08/21/2019   CO2 31 08/21/2019   Lab Results  Component Value Date   ALT 21 08/21/2019   AST 15 08/21/2019   ALKPHOS 74 08/21/2019   BILITOT 0.7 08/21/2019   Lab Results  Component Value Date   HGBA1C 5.6 08/21/2019   Lab Results  Component Value Date   INSULIN 18.3 10/15/2019   Lab Results  Component Value Date   TSH 1.72 08/21/2019   Lab Results  Component Value Date   CHOL 201 (H) 08/21/2019   HDL 49.60 08/21/2019   LDLCALC 121 (H) 08/21/2019   TRIG 152.0 (H) 08/21/2019   CHOLHDL 4 08/21/2019   Lab Results  Component Value Date   WBC 7.7 08/21/2019   HGB 12.4 08/21/2019   HCT 37.7 08/21/2019   MCV 85.5 08/21/2019   PLT 333.0 08/21/2019   No results found for: IRON, TIBC, FERRITIN  Attestation Statements:   Reviewed by clinician on day of visit: allergies, medications, problem list, medical history, surgical history, family history, social history, and previous encounter  notes.   Trude Mcburney, am acting as Energy manager for [place], FNP-C.  I have reviewed the above documentation for accuracy and completeness, and I agree with the above. -  Jesse Sans, FNP

## 2020-01-05 ENCOUNTER — Encounter (INDEPENDENT_AMBULATORY_CARE_PROVIDER_SITE_OTHER): Payer: Self-pay | Admitting: Family Medicine

## 2020-01-21 ENCOUNTER — Ambulatory Visit (INDEPENDENT_AMBULATORY_CARE_PROVIDER_SITE_OTHER): Payer: 59 | Admitting: Family Medicine

## 2020-01-21 ENCOUNTER — Other Ambulatory Visit: Payer: Self-pay

## 2020-01-21 ENCOUNTER — Encounter (INDEPENDENT_AMBULATORY_CARE_PROVIDER_SITE_OTHER): Payer: Self-pay | Admitting: Family Medicine

## 2020-01-21 VITALS — BP 96/61 | HR 86 | Temp 98.0°F | Ht 62.0 in | Wt 209.0 lb

## 2020-01-21 DIAGNOSIS — E559 Vitamin D deficiency, unspecified: Secondary | ICD-10-CM | POA: Diagnosis not present

## 2020-01-21 DIAGNOSIS — Z6838 Body mass index (BMI) 38.0-38.9, adult: Secondary | ICD-10-CM

## 2020-01-21 DIAGNOSIS — E8881 Metabolic syndrome: Secondary | ICD-10-CM

## 2020-01-21 DIAGNOSIS — E661 Drug-induced obesity: Secondary | ICD-10-CM

## 2020-01-21 MED ORDER — WEGOVY 0.25 MG/0.5ML ~~LOC~~ SOAJ: 0.2500 mg | SUBCUTANEOUS | 0 refills | Status: DC

## 2020-01-21 MED ORDER — VITAMIN D (ERGOCALCIFEROL) 1.25 MG (50000 UNIT) PO CAPS: 50000.0000 [IU] | ORAL_CAPSULE | ORAL | 0 refills | Status: DC

## 2020-01-22 NOTE — Progress Notes (Signed)
Chief Complaint:   OBESITY Erin Tucker is here to discuss her progress with her obesity treatment plan along with follow-up of her obesity related diagnoses. Erin Tucker is on the Category 1 Plan and keeping a food journal and adhering to recommended goals of 300-400 calories and 35 grams of protein at supper daily and states she is following her eating plan approximately 75% of the time. Erin Tucker states she is doing 0 minutes 0 times per week.  Today's visit was #: 7 Starting weight: 217 lbs Starting date: 10/15/2019 Today's weight: 209 lbs Today's date: 01/21/2020 Total lbs lost to date: 8 Total lbs lost since last in-office visit: 3  Interim History: Erin Tucker is now giving herself the [facility] shots. She had been having other people give them to her. She notes she had nausea at first which has subsided. She feels the Brand [facility] helps to reduce snacking and increase satiety. She feels she is adhering to the plan well. She reports she does not eat out much.  Subjective:   1. Insulin resistance Erin Tucker's last fasting insulin was elevated at 18.3, and A1c was 5.6. She is on [employer].   Lab Results  Component Value Date   INSULIN 18.3 10/15/2019   Lab Results  Component Value Date   HGBA1C 5.6 08/21/2019   2. Vitamin D deficiency Erin Tucker's Vit D level is low at 18.5. she is on prescription Vit D.  Assessment/Plan:   1. Insulin resistance Erin Tucker will continue her meal plan and Wegovy, and will continue to work on weight loss, exercise, and decreasing simple carbohydrates to help decrease the risk of diabetes.  2. Vitamin D deficiency  We will refill prescription Vitamin D for 1 month. Erin Tucker will follow-up for routine testing of Vitamin D, at least 2-3 times per year to avoid over-replacement.  - Vitamin D, Ergocalciferol, (DRISDOL) 1.25 MG (50000 UNIT) CAPS capsule; Take 1 capsule (50,000 Units total) by mouth every 7 (seven) days.  Dispense: 4 capsule; Refill: 0  3. Class 2 drug-induced obesity  with serious comorbidity and body mass index (BMI) of 38.0 to 38.9 in adult Erin Tucker is currently in the action stage of change. As such, her goal is to continue with weight loss efforts. She has agreed to the Category 1 Plan and keeping a food journal and adhering to recommended goals of 300-400 calories and 35 grams of protein at supper daily.   We discussed various medication options to help Erin Tucker with her weight loss efforts and we both agreed to continue Erin Tucker, and we will refill for 1 month.  - Semaglutide-Weight Management (WEGOVY) 0.25 MG/0.5ML SOAJ; Inject 0.25 mg into the skin once a week.  Dispense: 2 mL; Refill: 0  Exercise goals: We discussed adding exercise after the first of the year. She has a Corporate investment banker.  Behavioral modification strategies: increasing water intake and holiday eating strategies .  Erin Tucker has agreed to follow-up with our clinic in 4 weeks.  Objective:   Blood pressure 96/61, pulse 86, temperature 98 F (36.7 C), height 5\' 2"  (1.575 m), weight 209 lb (94.8 kg), SpO2 99 %. Body mass index is 38.23 kg/m.  General: Cooperative, alert, well developed, in no acute distress. HEENT: Conjunctivae and lids unremarkable. Cardiovascular: Regular rhythm.  Lungs: Normal work of breathing. Neurologic: No focal deficits.   Lab Results  Component Value Date   CREATININE 1.05 08/21/2019   BUN 12 08/21/2019   NA 139 08/21/2019   K 4.0 08/21/2019   CL 104 08/21/2019  CO2 31 08/21/2019   Lab Results  Component Value Date   ALT 21 08/21/2019   AST 15 08/21/2019   ALKPHOS 74 08/21/2019   BILITOT 0.7 08/21/2019   Lab Results  Component Value Date   HGBA1C 5.6 08/21/2019   Lab Results  Component Value Date   INSULIN 18.3 10/15/2019   Lab Results  Component Value Date   TSH 1.72 08/21/2019   Lab Results  Component Value Date   CHOL 201 (H) 08/21/2019   HDL 49.60 08/21/2019   LDLCALC 121 (H) 08/21/2019   TRIG 152.0 (H) 08/21/2019    CHOLHDL 4 08/21/2019   Lab Results  Component Value Date   WBC 7.7 08/21/2019   HGB 12.4 08/21/2019   HCT 37.7 08/21/2019   MCV 85.5 08/21/2019   PLT 333.0 08/21/2019   No results found for: IRON, TIBC, FERRITIN  Attestation Statements:   Reviewed by clinician on day of visit: allergies, medications, problem list, medical history, surgical history, family history, social history, and previous encounter notes.   Trude Mcburney, am acting as Energy manager for [place], FNP-C.  I have reviewed the above documentation for accuracy and completeness, and I agree with the above. -  Jesse Sans, FNP

## 2020-01-26 ENCOUNTER — Encounter (INDEPENDENT_AMBULATORY_CARE_PROVIDER_SITE_OTHER): Payer: Self-pay | Admitting: Family Medicine

## 2020-01-26 ENCOUNTER — Other Ambulatory Visit (INDEPENDENT_AMBULATORY_CARE_PROVIDER_SITE_OTHER): Payer: Self-pay | Admitting: Family Medicine

## 2020-01-26 ENCOUNTER — Encounter (INDEPENDENT_AMBULATORY_CARE_PROVIDER_SITE_OTHER): Payer: Self-pay

## 2020-01-26 DIAGNOSIS — E559 Vitamin D deficiency, unspecified: Secondary | ICD-10-CM

## 2020-01-26 NOTE — Telephone Encounter (Signed)
Left patient a mychart message.

## 2020-01-26 NOTE — Telephone Encounter (Signed)
This patient was last seen by Dawn Whitmire,FNP and currently has an upcoming appt scheduled on 02/18/20 with her.  

## 2020-01-28 NOTE — Telephone Encounter (Signed)
Reached out to the patient via phone call. She states that her rx for her medication will automatically refill within a few days. The refill will last her until her next appointment.

## 2020-02-02 NOTE — Telephone Encounter (Signed)
Called patient this morning to see make sure her refills went through. Her voicemail is not set up.

## 2020-02-04 ENCOUNTER — Encounter (INDEPENDENT_AMBULATORY_CARE_PROVIDER_SITE_OTHER): Payer: Self-pay

## 2020-02-04 NOTE — Telephone Encounter (Signed)
My chart message 02/04/20 6:49am

## 2020-02-18 ENCOUNTER — Encounter (INDEPENDENT_AMBULATORY_CARE_PROVIDER_SITE_OTHER): Payer: Self-pay | Admitting: Family Medicine

## 2020-02-18 ENCOUNTER — Other Ambulatory Visit: Payer: Self-pay

## 2020-02-18 ENCOUNTER — Ambulatory Visit (INDEPENDENT_AMBULATORY_CARE_PROVIDER_SITE_OTHER): Payer: 59 | Admitting: Family Medicine

## 2020-02-18 VITALS — BP 118/74 | HR 83 | Temp 98.3°F | Ht 62.0 in | Wt 206.0 lb

## 2020-02-18 DIAGNOSIS — Z9189 Other specified personal risk factors, not elsewhere classified: Secondary | ICD-10-CM

## 2020-02-18 DIAGNOSIS — E8881 Metabolic syndrome: Secondary | ICD-10-CM

## 2020-02-18 DIAGNOSIS — E559 Vitamin D deficiency, unspecified: Secondary | ICD-10-CM

## 2020-02-18 DIAGNOSIS — Z6838 Body mass index (BMI) 38.0-38.9, adult: Secondary | ICD-10-CM | POA: Diagnosis not present

## 2020-02-18 DIAGNOSIS — E88819 Insulin resistance, unspecified: Secondary | ICD-10-CM

## 2020-02-18 MED ORDER — WEGOVY 0.25 MG/0.5ML ~~LOC~~ SOAJ: 0.2500 mg | SUBCUTANEOUS | 0 refills | Status: DC

## 2020-02-18 MED ORDER — VITAMIN D (ERGOCALCIFEROL) 1.25 MG (50000 UNIT) PO CAPS: 50000.0000 [IU] | ORAL_CAPSULE | ORAL | 0 refills | Status: DC

## 2020-02-22 ENCOUNTER — Other Ambulatory Visit (INDEPENDENT_AMBULATORY_CARE_PROVIDER_SITE_OTHER): Payer: Self-pay | Admitting: Family Medicine

## 2020-02-22 DIAGNOSIS — E559 Vitamin D deficiency, unspecified: Secondary | ICD-10-CM

## 2020-02-23 NOTE — Progress Notes (Addendum)
Chief Complaint:   OBESITY Erin Tucker is here to discuss her progress with her obesity treatment plan along with follow-up of her obesity related diagnoses. [name] is on the Category 1 Plan and states she is following her eating plan approximately 75% of the time. [name] states she is not exercising at this time.  Today's visit was #: 8 Starting weight: 217 lbs Starting date: 10/15/2019 Today's weight: 206 lbs  Today's date: 02/18/2020 Total lbs lost to date: 11 lbs Total lbs lost since last in-office visit: 3 lbs  Interim History: Shawntrice feels the Reginal Lutes is working very well at 0.25 mg. Wegovy helps with  hunger. [name] is down 3 lbs despite the Christmas holiday. She denies constipation. She is getting her protein in.   Subjective:   1. Vitamin D deficiency Erin Tucker's Vitamin D level is very low at 18.8.on. She is currently taking prescription vitamin D 50,000 IU each week.  2. Insulin resistance . Last fasting insulin elevated at 18.3. A1c was 5.6. She is not on Metformin, but taking Wegovy.  Lab Results  Component Value Date   INSULIN 18.3 10/15/2019   Lab Results  Component Value Date   HGBA1C 5.6 08/21/2019    3. At risk for side effect of medication Erin Tucker is at risk for constipation and nausea with VFIEPP.    Assessment/Plan:   1. Vitamin D deficiency  She agrees to continue to take prescription Vitamin D @50 ,000 IU every week and will follow-up for routine testing of Vitamin D, at least 2-3 times per year to avoid over-replacement. Will refill Vitamin D.  - Vitamin D, Ergocalciferol, (DRISDOL) 1.25 MG (50000 UNIT) CAPS capsule; Take 1 capsule (50,000 Units total) by mouth every 7 (seven) days.  Dispense: 4 capsule; Refill: 0  2. Insulin resistance [name] will continue on Wegovy.  3. At risk for side effect of medication 03-24-1989) Erin Tucker was given approximately 15 minutes of drug side effect counseling today.  We discussed side effect possibility and risk versus  benefits. Erin Tucker agreed to the medication and will contact this office if these side effects are intolerable.  Repetitive spaced learning was employed today to elicit superior memory formation and behavioral change.  4. Class 2 severe obesity with serious comorbidity and body mass index (BMI) of 38.0 to 38.9 in adult, unspecified obesity type (HCC)   [name] is currently in the action stage of change. As such, her goal is to continue with weight loss efforts. She has agreed to the Category 1 Plan and keeping a food journal and adhering to recommended goals of 300-400 calories and 35 grams of protein at supper.  - Semaglutide-Weight Management (WEGOVY) 0.25 MG/0.5ML SOAJ; Inject 0.25 mg into the skin once a week.  Dispense: 2 mL; Refill: 0 We will start Saxenda if unable to obtain [facility] Has PA good through 03/27/2020   Exercise goals: Some exercise.  Behavioral modification strategies: increasing lean protein intake and increasing water intake.  Erin Tucker has agreed to follow-up with our clinic in 3 weeks fasting.   Objective:   Blood pressure 118/74, pulse 83, temperature 98.3 F (36.8 C), height 5\' 2"  (1.575 m), weight 206 lb (93.4 kg), SpO2 100 %. Body mass index is 37.68 kg/m.  General: Cooperative, alert, well developed, in no acute distress. HEENT: Conjunctivae and lids unremarkable. Cardiovascular: Regular rhythm.  Lungs: Normal work of breathing. Neurologic: No focal deficits.   Lab Results  Component Value Date   CREATININE 1.05 08/21/2019   BUN 12 08/21/2019  NA 139 08/21/2019   K 4.0 08/21/2019   CL 104 08/21/2019   CO2 31 08/21/2019   Lab Results  Component Value Date   ALT 21 08/21/2019   AST 15 08/21/2019   ALKPHOS 74 08/21/2019   BILITOT 0.7 08/21/2019   Lab Results  Component Value Date   HGBA1C 5.6 08/21/2019   Lab Results  Component Value Date   INSULIN 18.3 10/15/2019   Lab Results  Component Value Date   TSH 1.72 08/21/2019   Lab Results   Component Value Date   CHOL 201 (H) 08/21/2019   HDL 49.60 08/21/2019   LDLCALC 121 (H) 08/21/2019   TRIG 152.0 (H) 08/21/2019   CHOLHDL 4 08/21/2019   Lab Results  Component Value Date   WBC 7.7 08/21/2019   HGB 12.4 08/21/2019   HCT 37.7 08/21/2019   MCV 85.5 08/21/2019   PLT 333.0 08/21/2019   No results found for: IRON, TIBC, FERRITIN    Attestation Statements:   Reviewed by clinician on day of visit: allergies, medications, problem list, medical history, surgical history, family history, social history, and previous encounter notes.    IDelorse Limber, am acting as Energy manager for [employer], FNP.  I have reviewed the above documentation for accuracy and completeness, and I agree with the above. -  Jesse Sans, FNP

## 2020-02-24 ENCOUNTER — Encounter (INDEPENDENT_AMBULATORY_CARE_PROVIDER_SITE_OTHER): Payer: Self-pay | Admitting: Family Medicine

## 2020-02-24 ENCOUNTER — Ambulatory Visit: Payer: 59 | Admitting: Nurse Practitioner

## 2020-02-25 ENCOUNTER — Other Ambulatory Visit (HOSPITAL_COMMUNITY)
Admission: RE | Admit: 2020-02-25 | Discharge: 2020-02-25 | Disposition: A | Discharge status: Home / Self Care | Payer: 59 | Source: Ambulatory Visit | Attending: Advanced Practice Midwife | Admitting: Advanced Practice Midwife

## 2020-02-25 ENCOUNTER — Ambulatory Visit (INDEPENDENT_AMBULATORY_CARE_PROVIDER_SITE_OTHER): Payer: 59 | Admitting: Advanced Practice Midwife

## 2020-02-25 ENCOUNTER — Other Ambulatory Visit: Payer: Self-pay

## 2020-02-25 ENCOUNTER — Encounter: Payer: Self-pay | Admitting: Advanced Practice Midwife

## 2020-02-25 VITALS — BP 114/74 | Ht 62.0 in | Wt 214.2 lb

## 2020-02-25 DIAGNOSIS — Z124 Encounter for screening for malignant neoplasm of cervix: Secondary | ICD-10-CM | POA: Diagnosis not present

## 2020-02-25 DIAGNOSIS — Z01419 Encounter for gynecological examination (general) (routine) without abnormal findings: Secondary | ICD-10-CM

## 2020-02-25 DIAGNOSIS — N926 Irregular menstruation, unspecified: Secondary | ICD-10-CM

## 2020-02-25 DIAGNOSIS — Z3046 Encounter for surveillance of implantable subdermal contraceptive: Secondary | ICD-10-CM | POA: Diagnosis not present

## 2020-02-25 NOTE — Progress Notes (Signed)
Gynecology Annual Exam   PCP: Theadore Nan, NP  Chief Complaint:  Chief Complaint  Patient presents with  . Gynecologic Exam    Annual, nexplanon removal - Procedure RM    History of Present Illness: Patient is a 35 y.o. G1P1001 presents for annual exam. The patient has complaints today about irregular menstrual cycles and difficulty with weight loss despite diet and medication management. She is concerned she may have PCOS and is requesting lab testing for the same. She is due to have her Nexplanon removed and plans a hormone free interval. She is not currently sexually active.   LMP: No LMP recorded. Patient has had an implant. Average Interval: every 1-3 months Duration of flow: 7 days Heavy Menses: yes Clots: no Intermenstrual Bleeding: no Postcoital Bleeding: no Dysmenorrhea: no  The patient is not currently sexually active. She currently uses Nexplanon for contraception. She denies dyspareunia.  The patient does perform self breast exams.  There is no notable family history of breast or ovarian cancer in her family.  The patient wears seatbelts: yes.   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    The patient denies current symptoms of depression.    Review of Systems: Review of Systems  Constitutional: Negative for chills and fever.  HENT: Negative for congestion, ear discharge, ear pain, hearing loss, sinus pain and sore throat.   Eyes: Negative for blurred vision and double vision.  Respiratory: Negative for cough, shortness of breath and wheezing.   Cardiovascular: Negative for chest pain, palpitations and leg swelling.  Gastrointestinal: Negative for abdominal pain, blood in stool, constipation, diarrhea, heartburn, melena, nausea and vomiting.  Genitourinary: Negative for dysuria, flank pain, frequency, hematuria  and urgency.  Musculoskeletal: Negative for back pain, joint pain and myalgias.  Skin: Negative for itching and rash.  Neurological: Negative for dizziness, tingling, tremors, sensory change, speech change, focal weakness, seizures, loss of consciousness, weakness and headaches.  Endo/Heme/Allergies: Negative for environmental allergies. Does not bruise/bleed easily.       Positive for difficulty losing weight  Psychiatric/Behavioral: Negative for depression, hallucinations, memory loss, substance abuse and suicidal ideas. The patient is not nervous/anxious and does not have insomnia.     Past Medical History:  Patient Active Problem List   Diagnosis Date Noted  . Class 2 drug-induced obesity with serious comorbidity and body mass index (BMI) of 38.0 to 38.9 in adult 12/31/2019  . Insulin resistance 11/13/2019  . Vitamin D deficiency 10/16/2019  . Need for Tdap vaccination 09/02/2019  . Preventative health care 08/21/2019  . BMI 40.0-44.9, adult (HCC) 08/21/2019  . Obesity (BMI 35.0-39.9 without comorbidity)   . Thyroid nodule, cold   . Class 2 severe obesity with serious comorbidity and body mass index (BMI) of 38.0 to 38.9 in adult (HCC) 03/07/2016  . Subclinical hypothyroidism 02/03/2014    Past Surgical History:  Past Surgical History:  Procedure Laterality Date  . CESAREAN SECTION  12/14/2013  . THYROIDECTOMY, PARTIAL Right 07/03/2016   follicular adenoma    Gynecologic History:  No LMP recorded. Patient has had an implant. Contraception: Nexplanon Last Pap: 3 years ago Results were: no abnormalities   Obstetric History: G1P1001  Family History:  Family History  Problem Relation Age of Onset  . Diabetes Father   . Drug abuse Father   . High blood  pressure Father   . Colon cancer Maternal Grandfather   . Prostate cancer Maternal Grandfather   . Cancer Maternal Grandfather   . Obesity Mother   . Asthma Sister   . Cancer Brother   . Early death Paternal  Grandfather   . Breast cancer Neg Hx   . Ovarian cancer Neg Hx   . Heart disease Neg Hx     Social History:  Social History   Socioeconomic History  . Marital status: Single    Spouse name: Not on file  . Number of children: 1  . Years of education: Not on file  . Highest education level: Bachelor's degree (e.g., BA, AB, BS)  Occupational History  . Occupation: Charity fundraiser in [employer]  Tobacco Use  . Smoking status: Never Smoker  . Smokeless tobacco: Never Used  Vaping Use  . Vaping Use: Never used  Substance and Sexual Activity  . Alcohol use: Yes    Comment: rare  . Drug use: No  . Sexual activity: Yes    Partners: Male    Birth control/protection: Implant  Other Topics Concern  . Not on file  Social History Narrative   Single with one 56 yo son.    Social Determinants of Health   Financial Resource Strain: Not on file  Food Insecurity: Not on file  Transportation Needs: Not on file  Physical Activity: Not on file  Stress: Not on file  Social Connections: Not on file  Intimate Partner Violence: Not on file    Allergies:  Allergies  Allergen Reactions  . Mangifera Indica Itching and Swelling  . Other Itching    Red Hi-C drink    Medications: Prior to Admission medications   Medication Sig Start Date End Date Taking? Authorizing Provider  ibuprofen (ADVIL) 800 MG tablet Take 800 mg by mouth every 8 (eight) hours as needed.   Yes [provider]  Semaglutide-Weight Management (WEGOVY) 0.25 MG/0.5ML SOAJ Inject 0.25 mg into the skin once a week. 02/18/20  Yes Whitmire, Dawn W, FNP  Vitamin D, Ergocalciferol, (DRISDOL) 1.25 MG (50000 UNIT) CAPS capsule Take 1 capsule (50,000 Units total) by mouth every 7 (seven) days. 02/18/20  Yes Whitmire, Thermon Leyland, FNP    Physical Exam Vitals: Blood pressure 114/74, height 5\' 2"  (1.575 m), weight 214 lb 4 oz (97.2 kg).  General: NAD HEENT: normocephalic, anicteric Thyroid: no enlargement, no palpable nodules Pulmonary: No  increased work of breathing, CTAB Cardiovascular: RRR, distal pulses 2+ Breast: Breast symmetrical, no tenderness, no palpable nodules or masses, no skin or nipple retraction present, no nipple discharge.  No axillary or supraclavicular lymphadenopathy. Abdomen: NABS, soft, non-tender, non-distended.  Umbilicus without lesions.  No hepatomegaly, splenomegaly or masses palpable. No evidence of hernia  Genitourinary: exam limited by body habitus  External: Normal external female genitalia.  Normal urethral meatus, normal Bartholin's and Skene's glands.    Vagina: Normal vaginal mucosa, no evidence of prolapse.    Cervix: Grossly normal in appearance, no bleeding, no CMT  Uterus: deferred    Adnexa: deferred  Rectal: deferred  Lymphatic: no evidence of inguinal lymphadenopathy Extremities: no edema, erythema, or tenderness Neurologic: Grossly intact Psychiatric: mood appropriate, affect full   Assessment: 35 y.o. G1P1001 routine annual exam  Plan: Problem List Items Addressed This Visit   None   Visit Diagnoses    Well woman exam with routine gynecological exam    -  Primary   Relevant Orders   Cytology - PAP   Screening for  cervical cancer       Relevant Orders   Cytology - PAP   Nexplanon removal       Menstrual periods irregular       Relevant Orders   TSH+Prl+FSH+TestT+LH+DHEA S...   Estradiol   Progesterone       1) STI screening  was offered and declined  2)  ASCCP guidelines and rationale discussed.  Patient opts for every 3 years screening interval  3) Contraception - the patient is currently using  Nexplanon.  She is interested in changing to hormone free interval/abstinence  4) Routine healthcare maintenance including cholesterol, diabetes screening discussed Declines   5) Follow up as needed after labs result  6) Return in about 1 year (around 02/24/2021) for annual established gyn.   Tresea Mall, CNM [facility] 02/25/2020,  3:53 PM    GYNECOLOGY PROCEDURE NOTE  Nexplanon removal discussed in detail.  Risks of infection, bleeding, nerve injury all reviewed.  Patient understands risks and desires to proceed.  Verbal consent obtained.  Patient is certain she wants the Nexplanon removed.  All questions answered.  Procedure: Patient placed in dorsal supine with left arm above head, elbow flexed at 90 degrees, arm resting on examination table.  Nexplanon identified without problems.  Betadine scrub x2.  1 ml of 1% lidocaine injected under Nexplanon device without problems.  Sterile gloves applied.  Small 0.5cm incision made at distal tip of Nexplanon device with 11 blade scalpel.  Nexplanon brought to incision and grasped with a small kelly clamp.  Nexplanon removed intact without problems.  Pressure applied to incision.  Hemostasis obtained.  Steri-strips applied, followed by bandage and compression dressing.  Patient tolerated procedure well.  No complications.   Assessment: 35 y.o. year old female now s/p uncomplicated Nexplanon removal.  Plan: 1.  Patient given post procedure precautions and asked to call for fever, chills, redness or drainage from her incision, bleeding from incision.  She understands she will likely have a small bruise near site of removal and can remove bandage tomorrow and steri-strips in approximately 1 week.  2) Contraception: abstinence   Parke Poisson, CNM [facility] 02/25/20, 4:03 PM    [id number] for lidocaine block, 11982 for nexplanon removal

## 2020-02-25 NOTE — Patient Instructions (Signed)
Diet for Polycystic Ovary Syndrome Polycystic ovary syndrome (PCOS) is a common hormonal disorder that affects a woman's reproductive system. It can cause problems with menstrual periods and make it hard to get and stay pregnant. Changing what you eat can help your hormones reach normal levels, improve your health, and help you better manage PCOS. Following a balanced diet can help you lose weight and improve the way that your body uses the hormone insulin to control blood sugar. This may include:  Eating low-fat (lean) proteins, complex carbohydrates, fresh fruits and vegetables, low-fat dairy products, healthy fats, and fiber.  Cutting down on calories.  Exercising regularly. What are tips for following this plan?  Follow a balanced diet for meals and snacks. Eat breakfast, lunch, dinner, and one or two snacks every day.  Include protein in each meal and snack.  Choose whole grains instead of products that are made with refined flour.  Eat a variety of foods.  Exercise regularly as told by your health care provider. Aim to do at least 30 minutes of exercise on most days of the week.  If you are overweight or obese: ? Pay attention to how many calories you eat. Cutting down on calories can help you lose weight. ? Work with your health care provider or a dietitian to figure out how many calories you need each day. What foods should I eat? Fruits Include a variety of colors and types. All fruits are helpful for PCOS. Vegetables Include a variety of colors and types. All vegetables are helpful for PCOS. Grains Whole grains, such as whole wheat. Whole-grain breads, crackers, cereals, and pasta. Unsweetened oatmeal. Bulgur, barley, quinoa, and brown rice. Tortillas made from corn or whole-wheat flour. Meats and other proteins Lean proteins, such as fish, chicken, beans, eggs, and tofu. Dairy Low-fat dairy products, such as skim milk, cheese sticks, and yogurt. Beverages Low-fat or  fat-free drinks, such as water, low-fat milk, sugar-free drinks, and small amounts of 100% fruit juice. Seasonings and condiments Ketchup. Mustard. Barbecue sauce. Relish. Low-fat or fat-free mayonnaise. Fats and oils Olive oil or canola oil. Walnuts and almonds. The items listed above may not be a complete list of recommended foods and beverages. Contact a dietitian for more options.   What foods should I avoid? Foods that are high in calories or fat, especially saturated or trans fats. Fried foods. Sweets. Products that are made from refined white flour, including white bread, pastries, white rice, and pasta. The items listed above may not be a complete list of foods and beverages to avoid. Contact a dietitian for more information. Summary  PCOS is a hormonal imbalance that affects a woman's reproductive system. It can cause problems with menstrual periods and make it hard to get and stay pregnant.  You can help to manage your PCOS by exercising regularly and eating a healthy, varied diet of vegetables, fruit, whole grains, lean protein, and low-fat dairy products.  Changing what you eat can improve the way that your body uses insulin, help your hormones reach normal levels, and help you lose weight. This information is not intended to replace advice given to you by your health care provider. Make sure you discuss any questions you have with your health care provider. Document Revised: 07/10/2019 Document Reviewed: 07/10/2019 Elsevier Patient Education  2021 Elsevier Inc.  

## 2020-02-27 LAB — CYTOLOGY - PAP
Comment: NEGATIVE
Diagnosis: NEGATIVE
High risk HPV: NEGATIVE

## 2020-03-09 LAB — TSH+PRL+FSH+TESTT+LH+DHEA S...
17-Hydroxyprogesterone: 27 ng/dL
Androstenedione: 99 ng/dL (ref 41–262)
DHEA-SO4: 256 ug/dL (ref 84.8–378.0)
FSH: 4.9 m[IU]/mL
LH: 13.9 m[IU]/mL
Prolactin: 10 ng/mL (ref 4.8–23.3)
TSH: 2.41 u[IU]/mL (ref 0.450–4.500)
Testosterone, Free: 1.8 pg/mL (ref 0.0–4.2)
Testosterone: 29 ng/dL (ref 8–60)

## 2020-03-09 LAB — PROGESTERONE: Progesterone: 0.2 ng/mL

## 2020-03-09 LAB — ESTRADIOL: Estradiol: 26.5 pg/mL

## 2020-03-10 ENCOUNTER — Encounter (INDEPENDENT_AMBULATORY_CARE_PROVIDER_SITE_OTHER): Payer: Self-pay | Admitting: Family Medicine

## 2020-03-10 ENCOUNTER — Other Ambulatory Visit: Payer: Self-pay

## 2020-03-10 ENCOUNTER — Ambulatory Visit (INDEPENDENT_AMBULATORY_CARE_PROVIDER_SITE_OTHER): Payer: 59 | Admitting: Family Medicine

## 2020-03-10 VITALS — BP 92/62 | HR 78 | Temp 98.3°F | Ht 62.0 in | Wt 204.0 lb

## 2020-03-10 DIAGNOSIS — E559 Vitamin D deficiency, unspecified: Secondary | ICD-10-CM | POA: Diagnosis not present

## 2020-03-10 DIAGNOSIS — Z6837 Body mass index (BMI) 37.0-37.9, adult: Secondary | ICD-10-CM

## 2020-03-10 DIAGNOSIS — E7849 Other hyperlipidemia: Secondary | ICD-10-CM

## 2020-03-10 DIAGNOSIS — E8881 Metabolic syndrome: Secondary | ICD-10-CM

## 2020-03-10 DIAGNOSIS — Z9189 Other specified personal risk factors, not elsewhere classified: Secondary | ICD-10-CM | POA: Diagnosis not present

## 2020-03-10 DIAGNOSIS — E88819 Insulin resistance, unspecified: Secondary | ICD-10-CM

## 2020-03-11 LAB — LIPID PANEL WITH LDL/HDL RATIO
Cholesterol, Total: 243 mg/dL — ABNORMAL HIGH (ref 100–199)
HDL: 53 mg/dL (ref >39)
LDL Chol Calc (NIH): 170 mg/dL — ABNORMAL HIGH (ref 0–99)
LDL/HDL Ratio: 3.2 ratio (ref 0.0–3.2)
Triglycerides: 110 mg/dL (ref 0–149)
VLDL Cholesterol Cal: 20 mg/dL (ref 5–40)

## 2020-03-11 LAB — COMPREHENSIVE METABOLIC PANEL
ALT: 19 IU/L (ref 0–32)
AST: 10 IU/L (ref 0–40)
Albumin/Globulin Ratio: 1.6 (ref 1.2–2.2)
Albumin: 4.5 g/dL (ref 3.8–4.8)
Alkaline Phosphatase: 98 IU/L (ref 44–121)
BUN/Creatinine Ratio: 13 (ref 9–23)
BUN: 13 mg/dL (ref 6–20)
Bilirubin Total: 0.5 mg/dL (ref 0.0–1.2)
CO2: 27 mmol/L (ref 20–29)
Calcium: 9.7 mg/dL (ref 8.7–10.2)
Chloride: 102 mmol/L (ref 96–106)
Creatinine, Ser: 1.02 mg/dL — ABNORMAL HIGH (ref 0.57–1.00)
GFR calc Af Amer: 84 mL/min/{1.73_m2} (ref >59)
GFR calc non Af Amer: 72 mL/min/{1.73_m2} (ref >59)
Globulin, Total: 2.8 g/dL (ref 1.5–4.5)
Glucose: 93 mg/dL (ref 65–99)
Potassium: 4.6 mmol/L (ref 3.5–5.2)
Sodium: 141 mmol/L (ref 134–144)
Total Protein: 7.3 g/dL (ref 6.0–8.5)

## 2020-03-11 LAB — INSULIN, RANDOM: INSULIN: 8.8 u[IU]/mL (ref 2.6–24.9)

## 2020-03-11 LAB — HEMOGLOBIN A1C
Est. average glucose Bld gHb Est-mCnc: 111 mg/dL
Hgb A1c MFr Bld: 5.5 % (ref 4.8–5.6)

## 2020-03-11 LAB — VITAMIN D 25 HYDROXY (VIT D DEFICIENCY, FRACTURES): Vit D, 25-Hydroxy: 39.6 ng/mL (ref 30.0–100.0)

## 2020-03-14 NOTE — Progress Notes (Signed)
Chief Complaint:   OBESITY Erin Tucker is here to discuss her progress with her obesity treatment plan along with follow-up of her obesity related diagnoses. Erin Tucker is on the Category 1 Plan and states she is following her eating plan approximately 50% of the time. Erin Tucker states she is exercising 0 minutes 0 times per week.  Today's visit was #: 9 Starting weight: 217 lbs Starting date: 10/15/2019 Today's weight: 204 lbs Today's date: 03/10/2020 Total lbs lost to date: 13 lbs Total lbs lost since last in-office visit: 2 lbs  Interim History: Erin Tucker feels she has not adhered to the plan well. She skips meals on work days. She denies excessive hunger and cravings. She wants to stay at her current dose of Wegovy (0.25 mg). She denies nausea and constipation.  Subjective:   1. Other hyperlipidemia Erin Tucker's LDL is not at goal (170 at last check). Her triglycerides and HDL are within normal limits. She is not on statin therapy.  Lab Results  Component Value Date   ALT 19 03/10/2020   AST 10 03/10/2020   ALKPHOS 98 03/10/2020   BILITOT 0.5 03/10/2020   Lab Results  Component Value Date   CHOL 243 (H) 03/10/2020   HDL 53 03/10/2020   LDLCALC 170 (H) 03/10/2020   TRIG 110 03/10/2020   CHOLHDL 4 08/21/2019    2. Insulin resistance Erin Tucker is on Wegovy.Her last fasting insulin was elevated at 18.3 and A1c was 5.6.  Lab Results  Component Value Date   INSULIN 8.8 03/10/2020   INSULIN 18.3 10/15/2019   Lab Results  Component Value Date   HGBA1C 5.5 03/10/2020    3. Vitamin D deficiency Erin Tucker's Vitamin D level was 18.8 on 10/15/2019. She is currently taking prescription vitamin D 50,000 IU each week. She denies nausea, vomiting or muscle weakness.  4. At risk of diabetes mellitus Erin Tucker is at higher than average risk for developing diabetes due to family history, insulin resistance, and obesity.    Assessment/Plan:   1. Other hyperlipidemia  We discussed several lifestyle  modifications today and Erin Tucker will continue to work on diet, exercise and weight loss efforts.  - Lipid Panel With LDL/HDL Ratio  2. Insulin resistance . Erin Tucker agreed to follow-up with [place] as directed to closely monitor her progress. Check labs today.  - Insulin, random - Hemoglobin A1c - Comprehensive metabolic panel  3. Vitamin D deficiency She agrees to continue to take prescription Vitamin D @50 ,000 IU every week and will follow-up for routine testing of Vitamin D, at least 2-3 times per year to avoid over-replacement. Check Vit D today.  - VITAMIN D 25 Hydroxy (Vit-D Deficiency, Fractures)  4. At risk of diabetes mellitus Erin Tucker was given approximately 15 minutes of diabetes education and counseling today. We discussed intensive lifestyle modifications today with an emphasis on weight loss as well as increasing exercise and decreasing simple carbohydrates in her diet. We also reviewed medication options with an emphasis on risk versus benefit of those discussed.   Repetitive spaced learning was employed today to elicit superior memory formation and behavioral change.  5. Class 2 severe obesity with serious comorbidity and body mass index (BMI) of 37.0 to 37.9 in adult, unspecified obesity type (HCC) Erin Tucker is currently in the action stage of change. As such, her goal is to continue with weight loss efforts. She has agreed to the Category 1 Plan.   Exercise goals: Go to 2 times a week.  Behavioral modification strategies: increasing  lean protein intake and no skipping meals.  Erin Tucker has agreed to follow-up with our clinic in 3 weeks.  Erin Tucker was informed we would discuss her lab results at her next visit unless there is a critical issue that needs to be addressed sooner. Erin Tucker agreed to keep her next visit at the agreed upon time to discuss these results.  Objective:   Blood pressure 92/62, pulse 78, temperature 98.3 F (36.8 C), height 5\' 2"  (1.575 m), weight  204 lb (92.5 kg), SpO2 100 %. Body mass index is 37.31 kg/m.  General: Cooperative, alert, well developed, in no acute distress. HEENT: Conjunctivae and lids unremarkable. Cardiovascular: Regular rhythm.  Lungs: Normal work of breathing. Neurologic: No focal deficits.   Lab Results  Component Value Date   CREATININE 1.02 (H) 03/10/2020   BUN 13 03/10/2020   NA 141 03/10/2020   K 4.6 03/10/2020   CL 102 03/10/2020   CO2 27 03/10/2020   Lab Results  Component Value Date   ALT 19 03/10/2020   AST 10 03/10/2020   ALKPHOS 98 03/10/2020   BILITOT 0.5 03/10/2020   Lab Results  Component Value Date   HGBA1C 5.5 03/10/2020   HGBA1C 5.6 08/21/2019   Lab Results  Component Value Date   INSULIN 8.8 03/10/2020   INSULIN 18.3 10/15/2019   Lab Results  Component Value Date   TSH 2.410 02/25/2020   Lab Results  Component Value Date   CHOL 243 (H) 03/10/2020   HDL 53 03/10/2020   LDLCALC 170 (H) 03/10/2020   TRIG 110 03/10/2020   CHOLHDL 4 08/21/2019   Lab Results  Component Value Date   WBC 7.7 08/21/2019   HGB 12.4 08/21/2019   HCT 37.7 08/21/2019   MCV 85.5 08/21/2019   PLT 333.0 08/21/2019   No results found for: IRON, TIBC, FERRITIN  Attestation Statements:   Reviewed by clinician on day of visit: allergies, medications, problem list, medical history, surgical history, family history, social history, and previous encounter notes.  10/22/2019, am acting as Edmund Hilda for Energy manager, FNP.  I have reviewed the above documentation for accuracy and completeness, and I agree with the above. -  [place], FNP

## 2020-03-15 DIAGNOSIS — E7849 Other hyperlipidemia: Secondary | ICD-10-CM | POA: Insufficient documentation

## 2020-03-19 ENCOUNTER — Other Ambulatory Visit (INDEPENDENT_AMBULATORY_CARE_PROVIDER_SITE_OTHER): Payer: Self-pay | Admitting: Family Medicine

## 2020-03-19 DIAGNOSIS — E559 Vitamin D deficiency, unspecified: Secondary | ICD-10-CM

## 2020-03-22 ENCOUNTER — Other Ambulatory Visit (INDEPENDENT_AMBULATORY_CARE_PROVIDER_SITE_OTHER): Payer: Self-pay | Admitting: Family Medicine

## 2020-03-22 ENCOUNTER — Encounter (INDEPENDENT_AMBULATORY_CARE_PROVIDER_SITE_OTHER): Payer: Self-pay

## 2020-03-22 NOTE — Telephone Encounter (Signed)
Would you like to refill? Pt states that she took her last pill on Wednesday of last week.

## 2020-03-22 NOTE — Telephone Encounter (Signed)
MyChart message sent to pt to find out if they have enough medication to get them through until next appt.   

## 2020-03-22 NOTE — Telephone Encounter (Signed)
Last OV with Erin Tucker 

## 2020-03-31 ENCOUNTER — Encounter (INDEPENDENT_AMBULATORY_CARE_PROVIDER_SITE_OTHER): Payer: Self-pay | Admitting: Family Medicine

## 2020-03-31 ENCOUNTER — Other Ambulatory Visit: Payer: Self-pay

## 2020-03-31 ENCOUNTER — Ambulatory Visit (INDEPENDENT_AMBULATORY_CARE_PROVIDER_SITE_OTHER): Payer: 59 | Admitting: Family Medicine

## 2020-03-31 VITALS — BP 99/68 | HR 91 | Temp 98.5°F | Ht 62.0 in | Wt 205.0 lb

## 2020-03-31 DIAGNOSIS — E559 Vitamin D deficiency, unspecified: Secondary | ICD-10-CM | POA: Diagnosis not present

## 2020-03-31 DIAGNOSIS — Z9189 Other specified personal risk factors, not elsewhere classified: Secondary | ICD-10-CM

## 2020-03-31 DIAGNOSIS — E8881 Metabolic syndrome: Secondary | ICD-10-CM

## 2020-03-31 DIAGNOSIS — Z6837 Body mass index (BMI) 37.0-37.9, adult: Secondary | ICD-10-CM

## 2020-03-31 DIAGNOSIS — E7849 Other hyperlipidemia: Secondary | ICD-10-CM

## 2020-03-31 MED ORDER — WEGOVY 0.5 MG/0.5ML ~~LOC~~ SOAJ: 0.5000 mg | SUBCUTANEOUS | 0 refills | Status: DC

## 2020-04-01 NOTE — Progress Notes (Signed)
Chief Complaint:   OBESITY Erin Tucker is here to discuss her progress with her obesity treatment plan along with follow-up of her obesity related diagnoses. Erin Tucker is on the Category 1 Plan and states she is following her eating plan approximately 75% of the time. Erin Tucker states she is doing cardio and weights 60 minutes 2-3 times per week.  Today's visit was #: 10 Starting weight: 217 lbs Starting date: 10/15/2019 Today's weight: 205 lbs Today's date: 03/31/2020 Total lbs lost to date: 12 lbs Total lbs lost since last in-office visit: (+1)  Interim History: Erin Tucker has been consistent with exercise until she hurt her back last week. She is on Wegovy 0.25 mg weekly. Her hunger and cravings have increased over the past few weeks. She denies nausea or constipation.   Subjective:   1. Other hyperlipidemia Worsening. Discussed labs with patient today. Her LDL has increased to 170 but HDL and triglycerides are within normal limits. She is not on statin therapy.  Lab Results  Component Value Date   ALT 19 03/10/2020   AST 10 03/10/2020   ALKPHOS 98 03/10/2020   BILITOT 0.5 03/10/2020   Lab Results  Component Value Date   CHOL 243 (H) 03/10/2020   HDL 53 03/10/2020   LDLCALC 170 (H) 03/10/2020   TRIG 110 03/10/2020   CHOLHDL 4 08/21/2019    2. Vitamin D deficiency Pt's Vit D level has improved to 39.6. She is currently taking prescription vitamin D 50,000 IU each week. She reports fatigue.   Ref. Range 03/10/2020 08:14  Vitamin D, 25-Hydroxy Latest Ref Range: 30.0 - 100.0 ng/mL 39.6   3. At risk for side effect of medication Erin Tucker is at risk for side effect of medication due to increasing Wegovy dose.  Assessment/Plan:   1. Other hyperlipidemia Cardiovascular risk and specific lipid/LDL goals reviewed.  We discussed several lifestyle modifications today and Erin Tucker will continue to work on diet, exercise and weight loss efforts.  Statin therapy not indicated at this time.  2.  Vitamin D deficiency Low Vitamin D level contributes to fatigue and are associated with obesity, breast, and colon cancer. She agrees to continue to take prescription Vitamin D @50 ,000 IU every week and will follow-up for routine testing of Vitamin D, at least 2-3 times per year to avoid over-replacement.  3. At risk for side effect of medication Erin Tucker was given approximately 15 minutes of drug side effect counseling today.  We discussed side effect possibility and risk versus benefits. Erin Tucker agreed to the medication and will contact this office if these side effects are intolerable.  Repetitive spaced learning was employed today to elicit superior memory formation and behavioral change.  4. Class 2 severe obesity with serious comorbidity and body mass index (BMI) of 37.0 to 37.9 in adult, unspecified obesity type (HCC) Erin Tucker is currently in the action stage of change. As such, her goal is to continue with weight loss efforts. She has agreed to the Category 1 Plan.   We discussed various medication options to help Erin Tucker with her weight loss efforts and we both agreed to increase Wegovy dose to 0.50 mg, as per below. - Semaglutide-Weight Management (WEGOVY) 0.5 MG/0.5ML SOAJ; Inject 0.5 mg into the skin once a week.  Dispense: 2 mL; Refill: 0  Exercise goals: As is- Restart exercise after back pain subsides.  Behavioral modification strategies: increasing water intake and decreasing eating out.  Erin Tucker has agreed to follow-up with our clinic in 3 weeks.  Objective:  Blood pressure 99/68, pulse 91, temperature 98.5 F (36.9 C), height 5\' 2"  (1.575 m), weight 205 lb (93 kg), SpO2 100 %. Body mass index is 37.49 kg/m.  General: Cooperative, alert, well developed, in no acute distress. HEENT: Conjunctivae and lids unremarkable. Cardiovascular: Regular rhythm.  Lungs: Normal work of breathing. Neurologic: No focal deficits.   Lab Results  Component Value Date   CREATININE 1.02 (H)  03/10/2020   BUN 13 03/10/2020   NA 141 03/10/2020   K 4.6 03/10/2020   CL 102 03/10/2020   CO2 27 03/10/2020   Lab Results  Component Value Date   ALT 19 03/10/2020   AST 10 03/10/2020   ALKPHOS 98 03/10/2020   BILITOT 0.5 03/10/2020   Lab Results  Component Value Date   HGBA1C 5.5 03/10/2020   HGBA1C 5.6 08/21/2019   Lab Results  Component Value Date   INSULIN 8.8 03/10/2020   INSULIN 18.3 10/15/2019   Lab Results  Component Value Date   TSH 2.410 02/25/2020   Lab Results  Component Value Date   CHOL 243 (H) 03/10/2020   HDL 53 03/10/2020   LDLCALC 170 (H) 03/10/2020   TRIG 110 03/10/2020   CHOLHDL 4 08/21/2019   Lab Results  Component Value Date   WBC 7.7 08/21/2019   HGB 12.4 08/21/2019   HCT 37.7 08/21/2019   MCV 85.5 08/21/2019   PLT 333.0 08/21/2019   No results found for: IRON, TIBC, FERRITIN.  Attestation Statements:   Reviewed by clinician on day of visit: allergies, medications, problem list, medical history, surgical history, family history, social history, and previous encounter notes.  10/22/2019, am acting as Edmund Hilda for Energy manager, FNP.  I have reviewed the above documentation for accuracy and completeness, and I agree with the above. -  [place], FNP

## 2020-04-05 ENCOUNTER — Encounter (INDEPENDENT_AMBULATORY_CARE_PROVIDER_SITE_OTHER): Payer: Self-pay | Admitting: Family Medicine

## 2020-04-05 ENCOUNTER — Encounter (INDEPENDENT_AMBULATORY_CARE_PROVIDER_SITE_OTHER): Payer: Self-pay

## 2020-04-08 ENCOUNTER — Other Ambulatory Visit (INDEPENDENT_AMBULATORY_CARE_PROVIDER_SITE_OTHER): Payer: Self-pay | Admitting: Family Medicine

## 2020-04-08 DIAGNOSIS — Z6838 Body mass index (BMI) 38.0-38.9, adult: Secondary | ICD-10-CM

## 2020-04-18 ENCOUNTER — Other Ambulatory Visit (INDEPENDENT_AMBULATORY_CARE_PROVIDER_SITE_OTHER): Payer: Self-pay | Admitting: Family Medicine

## 2020-04-18 DIAGNOSIS — E559 Vitamin D deficiency, unspecified: Secondary | ICD-10-CM

## 2020-04-19 NOTE — Telephone Encounter (Signed)
Last OV with Dawn 

## 2020-04-19 NOTE — Telephone Encounter (Signed)
Refill request

## 2020-04-26 ENCOUNTER — Encounter (INDEPENDENT_AMBULATORY_CARE_PROVIDER_SITE_OTHER): Payer: Self-pay

## 2020-04-26 ENCOUNTER — Ambulatory Visit (INDEPENDENT_AMBULATORY_CARE_PROVIDER_SITE_OTHER): Payer: 59 | Admitting: Bariatrics

## 2020-04-26 ENCOUNTER — Encounter (INDEPENDENT_AMBULATORY_CARE_PROVIDER_SITE_OTHER): Payer: Self-pay | Admitting: Bariatrics

## 2020-04-26 ENCOUNTER — Encounter (INDEPENDENT_AMBULATORY_CARE_PROVIDER_SITE_OTHER): Payer: Self-pay | Admitting: Family Medicine

## 2020-04-26 ENCOUNTER — Other Ambulatory Visit: Payer: Self-pay

## 2020-04-26 VITALS — BP 101/66 | HR 77 | Temp 98.2°F | Ht 62.0 in | Wt 205.0 lb

## 2020-04-26 DIAGNOSIS — Z6837 Body mass index (BMI) 37.0-37.9, adult: Secondary | ICD-10-CM | POA: Diagnosis not present

## 2020-04-26 DIAGNOSIS — E6609 Other obesity due to excess calories: Secondary | ICD-10-CM | POA: Diagnosis not present

## 2020-04-26 DIAGNOSIS — E559 Vitamin D deficiency, unspecified: Secondary | ICD-10-CM | POA: Diagnosis not present

## 2020-04-26 DIAGNOSIS — E8881 Metabolic syndrome: Secondary | ICD-10-CM | POA: Diagnosis not present

## 2020-04-27 ENCOUNTER — Encounter (INDEPENDENT_AMBULATORY_CARE_PROVIDER_SITE_OTHER): Payer: Self-pay | Admitting: Bariatrics

## 2020-04-27 NOTE — Progress Notes (Signed)
Chief Complaint:   OBESITY Erin Tucker is here to discuss her progress with her obesity treatment plan along with follow-up of her obesity related diagnoses. [name] is on the Category 1 Plan and states she is following her eating plan approximately 25% of the time. [name] states she is doing cardio and weights for 60 minutes 3 times per week.  Today's visit was #: 11 Starting weight: 217 lbs Starting date: 10/15/2019 Today's weight: 205 lbs Today's date: 04/26/2020 Total lbs lost to date: 12 lbs Total lbs lost since last in-office visit: 0  Interim History: Erin Tucker's weight remains the same since her last visit.  She is not on Wegovy.  Insurance denied the claim for [facility] as she did not lose enough weight.    Subjective:   1. Vitamin D deficiency Erin Tucker's Vitamin D level was 39.6 on 03/10/2020. She is currently taking prescription vitamin D 50,000 IU each week. She denies nausea, vomiting or muscle weakness.  2. Insulin resistance [name] has a diagnosis of insulin resistance based on her elevated fasting insulin level >5. She continues to work on diet and exercise to decrease her risk of diabetes.  She is on no medications for this.  Lab Results  Component Value Date   INSULIN 8.8 03/10/2020   INSULIN 18.3 10/15/2019   Lab Results  Component Value Date   HGBA1C 5.5 03/10/2020   Assessment/Plan:   1. Vitamin D deficiency Low Vitamin D level contributes to fatigue and are associated with obesity, breast, and colon cancer. She agrees to continue to take prescription Vitamin D @50 ,000 IU every week and will follow-up for routine testing of Vitamin D, at least 2-3 times per year to avoid over-replacement.  2. Insulin resistance [name] will continue to work on weight loss, exercise, and decreasing simple carbohydrates to help decrease the risk of diabetes. [name] agreed to follow-up with as directed to closely monitor her progress.  Decrease carbohydrates and increase healthy  protein and fats.  3. Class 2 obesity due to excess calories without serious comorbidity with body mass index (BMI) of 37.0 to 37.9 in adult  Erin Tucker is currently in the action stage of change. As such, her goal is to continue with weight loss efforts. She has agreed to the Category 1 Plan.   She will work on adhering more closely to the plan, meal planning, and increasing water intake.  She has stopped Merry Proud.  Exercise goals: Exercise for 1 hour 3 times per week.  Behavioral modification strategies: increasing lean protein intake, decreasing simple carbohydrates, increasing vegetables, increasing water intake, decreasing eating out, no skipping meals, meal planning and cooking strategies, keeping healthy foods in the home and planning for success.  Erin Tucker has agreed to follow-up with our clinic in 2-3 weeks with Merry Proud, FNP. She was informed of the importance of frequent follow-up visits to maximize her success with intensive lifestyle modifications for her multiple health conditions.   Objective:   Blood pressure 101/66, pulse 77, temperature 98.2 F (36.8 C), height 5\' 2"  (1.575 m), weight 205 lb (93 kg), SpO2 99 %. Body mass index is 37.49 kg/m.  General: Cooperative, alert, well developed, in no acute distress. HEENT: Conjunctivae and lids unremarkable. Cardiovascular: Regular rhythm.  Lungs: Normal work of breathing. Neurologic: No focal deficits.   Lab Results  Component Value Date   CREATININE 1.02 (H) 03/10/2020   BUN 13 03/10/2020   NA 141 03/10/2020   K 4.6 03/10/2020   CL 102 03/10/2020  CO2 27 03/10/2020   Lab Results  Component Value Date   ALT 19 03/10/2020   AST 10 03/10/2020   ALKPHOS 98 03/10/2020   BILITOT 0.5 03/10/2020   Lab Results  Component Value Date   HGBA1C 5.5 03/10/2020   HGBA1C 5.6 08/21/2019   Lab Results  Component Value Date   INSULIN 8.8 03/10/2020   INSULIN 18.3 10/15/2019   Lab Results  Component Value Date   TSH 2.410  02/25/2020   Lab Results  Component Value Date   CHOL 243 (H) 03/10/2020   HDL 53 03/10/2020   LDLCALC 170 (H) 03/10/2020   TRIG 110 03/10/2020   CHOLHDL 4 08/21/2019   Lab Results  Component Value Date   WBC 7.7 08/21/2019   HGB 12.4 08/21/2019   HCT 37.7 08/21/2019   MCV 85.5 08/21/2019   PLT 333.0 08/21/2019   Attestation Statements:   Reviewed by clinician on day of visit: allergies, medications, problem list, medical history, surgical history, family history, social history, and previous encounter notes.  Time spent on visit including pre-visit chart review and post-visit care and charting was 20 minutes.   I, Insurance claims handler, CMA, am acting as Energy manager for [employer], DO  I have reviewed the above documentation for accuracy and completeness, and I agree with the above. Corinna Capra, DO

## 2020-04-28 DIAGNOSIS — Z20822 Contact with and (suspected) exposure to covid-19: Secondary | ICD-10-CM | POA: Diagnosis not present

## 2020-05-10 ENCOUNTER — Ambulatory Visit (INDEPENDENT_AMBULATORY_CARE_PROVIDER_SITE_OTHER): Payer: 59 | Admitting: Family Medicine

## 2020-05-10 ENCOUNTER — Encounter (INDEPENDENT_AMBULATORY_CARE_PROVIDER_SITE_OTHER): Payer: Self-pay | Admitting: Family Medicine

## 2020-05-10 ENCOUNTER — Other Ambulatory Visit: Payer: Self-pay

## 2020-05-10 VITALS — BP 98/64 | HR 81 | Temp 98.0°F | Ht 62.0 in | Wt 206.0 lb

## 2020-05-10 DIAGNOSIS — E8881 Metabolic syndrome: Secondary | ICD-10-CM

## 2020-05-10 DIAGNOSIS — E88819 Insulin resistance, unspecified: Secondary | ICD-10-CM

## 2020-05-10 DIAGNOSIS — Z6839 Body mass index (BMI) 39.0-39.9, adult: Secondary | ICD-10-CM | POA: Diagnosis not present

## 2020-05-11 ENCOUNTER — Encounter (INDEPENDENT_AMBULATORY_CARE_PROVIDER_SITE_OTHER): Payer: Self-pay | Admitting: Family Medicine

## 2020-05-11 NOTE — Progress Notes (Signed)
Chief Complaint:   OBESITY Erin Tucker is here to discuss her progress with her obesity treatment plan along with follow-up of her obesity related diagnoses. Erin Tucker is on the Category 1 Plan and states she is following her eating plan approximately 25% of the time. Erin Tucker states she is doing cardio and weight lifting for 60 minutes 3 times per week.  Today's visit was #: 12 Starting weight: 217 lbs Starting date: 10/15/2019 Today's weight: 206 lbs Today's date: 05/10/2020 Total lbs lost to date: 11 lbs Total lbs lost since last in-office visit: +1  Interim History: Erin Tucker is now back on Wegovy 0.5 mg. She had been off of it because insurance denied but then covered on appeal.  She says her appetite is well controlled.  She has also been sick with a cold and has been on a vacation to the [place].  She hopes to be able to get back on plan this week.  She is skipping dinner quite often.  Subjective:   1. Insulin resistance Last fasting insulin was elevated at 8.8, but down from initial check (18.3).  On Wegovy.  Lab Results  Component Value Date   INSULIN 8.8 03/10/2020   INSULIN 18.3 10/15/2019   Lab Results  Component Value Date   HGBA1C 5.5 03/10/2020   Assessment/Plan:   1. Insulin resistance Continue Wegovy and meal plan.  2. Obesity: BMI 37  Erin Tucker is currently in the action stage of change. As such, her goal is to continue with weight loss efforts. She has agreed to the Category 1 Plan.   Exercise goals: As is.  Behavioral modification strategies: increasing lean protein intake and decreasing simple carbohydrates.  Erin Tucker has agreed to follow-up with our clinic in 3 weeks.   Objective:   Blood pressure 98/64, pulse 81, temperature 98 F (36.7 C), height 5\' 2"  (1.575 m), weight 206 lb (93.4 kg), SpO2 98 %. Body mass index is 37.68 kg/m.  General: Cooperative, alert, well developed, in no acute distress. HEENT: Conjunctivae and lids unremarkable. Cardiovascular:  Regular rhythm.  Lungs: Normal work of breathing. Neurologic: No focal deficits.   Lab Results  Component Value Date   CREATININE 1.02 (H) 03/10/2020   BUN 13 03/10/2020   NA 141 03/10/2020   K 4.6 03/10/2020   CL 102 03/10/2020   CO2 27 03/10/2020   Lab Results  Component Value Date   ALT 19 03/10/2020   AST 10 03/10/2020   ALKPHOS 98 03/10/2020   BILITOT 0.5 03/10/2020   Lab Results  Component Value Date   HGBA1C 5.5 03/10/2020   HGBA1C 5.6 08/21/2019   Lab Results  Component Value Date   INSULIN 8.8 03/10/2020   INSULIN 18.3 10/15/2019   Lab Results  Component Value Date   TSH 2.410 02/25/2020   Lab Results  Component Value Date   CHOL 243 (H) 03/10/2020   HDL 53 03/10/2020   LDLCALC 170 (H) 03/10/2020   TRIG 110 03/10/2020   CHOLHDL 4 08/21/2019   Lab Results  Component Value Date   WBC 7.7 08/21/2019   HGB 12.4 08/21/2019   HCT 37.7 08/21/2019   MCV 85.5 08/21/2019   PLT 333.0 08/21/2019   Attestation Statements:   Reviewed by clinician on day of visit: allergies, medications, problem list, medical history, surgical history, family history, social history, and previous encounter notes.  I, 10/22/2019, CMA, am acting as Insurance claims handler for Energy manager, FNP.  I have reviewed the above documentation for accuracy and completeness,  and I agree with the above. -  Jesse Sans, FNP

## 2020-05-21 ENCOUNTER — Other Ambulatory Visit (INDEPENDENT_AMBULATORY_CARE_PROVIDER_SITE_OTHER): Payer: Self-pay | Admitting: Family Medicine

## 2020-05-21 DIAGNOSIS — Z6839 Body mass index (BMI) 39.0-39.9, adult: Secondary | ICD-10-CM

## 2020-05-21 DIAGNOSIS — E559 Vitamin D deficiency, unspecified: Secondary | ICD-10-CM

## 2020-05-24 ENCOUNTER — Other Ambulatory Visit (INDEPENDENT_AMBULATORY_CARE_PROVIDER_SITE_OTHER): Payer: Self-pay | Admitting: Family Medicine

## 2020-05-24 DIAGNOSIS — E6609 Other obesity due to excess calories: Secondary | ICD-10-CM

## 2020-05-24 DIAGNOSIS — E559 Vitamin D deficiency, unspecified: Secondary | ICD-10-CM

## 2020-05-24 MED ORDER — VITAMIN D (ERGOCALCIFEROL) 1.25 MG (50000 UNIT) PO CAPS: 1.0000 | ORAL_CAPSULE | ORAL | 0 refills | Status: DC

## 2020-05-24 MED ORDER — WEGOVY 0.5 MG/0.5ML ~~LOC~~ SOAJ: 0.5000 mg | SUBCUTANEOUS | 0 refills | Status: DC

## 2020-05-24 NOTE — Telephone Encounter (Signed)
Refill request

## 2020-05-31 ENCOUNTER — Ambulatory Visit (INDEPENDENT_AMBULATORY_CARE_PROVIDER_SITE_OTHER): Payer: 59 | Admitting: Family Medicine

## 2020-06-22 ENCOUNTER — Other Ambulatory Visit (INDEPENDENT_AMBULATORY_CARE_PROVIDER_SITE_OTHER): Payer: Self-pay | Admitting: Family Medicine

## 2020-06-22 DIAGNOSIS — E559 Vitamin D deficiency, unspecified: Secondary | ICD-10-CM

## 2020-06-23 ENCOUNTER — Ambulatory Visit (INDEPENDENT_AMBULATORY_CARE_PROVIDER_SITE_OTHER): Payer: 59 | Admitting: Family Medicine

## 2020-06-23 ENCOUNTER — Other Ambulatory Visit (INDEPENDENT_AMBULATORY_CARE_PROVIDER_SITE_OTHER): Payer: Self-pay | Admitting: Family Medicine

## 2020-06-23 ENCOUNTER — Encounter (INDEPENDENT_AMBULATORY_CARE_PROVIDER_SITE_OTHER): Payer: Self-pay

## 2020-06-23 DIAGNOSIS — E6609 Other obesity due to excess calories: Secondary | ICD-10-CM

## 2020-06-23 DIAGNOSIS — Z6837 Body mass index (BMI) 37.0-37.9, adult: Secondary | ICD-10-CM

## 2020-06-23 NOTE — Telephone Encounter (Signed)
Refill request. Pt appointment cancelled due to [facility] being [facility].

## 2020-06-23 NOTE — Telephone Encounter (Signed)
Pt last seen by Dawn Whitmire, FNP.  

## 2020-07-17 ENCOUNTER — Other Ambulatory Visit (INDEPENDENT_AMBULATORY_CARE_PROVIDER_SITE_OTHER): Payer: Self-pay | Admitting: Family Medicine

## 2020-07-17 DIAGNOSIS — Z6837 Body mass index (BMI) 37.0-37.9, adult: Secondary | ICD-10-CM

## 2020-07-17 DIAGNOSIS — E6609 Other obesity due to excess calories: Secondary | ICD-10-CM

## 2020-11-03 DIAGNOSIS — H5213 Myopia, bilateral: Secondary | ICD-10-CM | POA: Diagnosis not present

## 2021-09-21 ENCOUNTER — Encounter (INDEPENDENT_AMBULATORY_CARE_PROVIDER_SITE_OTHER): Payer: Self-pay

## 2021-11-04 ENCOUNTER — Ambulatory Visit
Admission: EM | Admit: 2021-11-04 | Discharge: 2021-11-04 | Disposition: A | Discharge status: Home / Self Care | Payer: 59 | Attending: Emergency Medicine | Admitting: Emergency Medicine

## 2021-11-04 ENCOUNTER — Encounter: Payer: Self-pay | Admitting: Emergency Medicine

## 2021-11-04 DIAGNOSIS — N390 Urinary tract infection, site not specified: Secondary | ICD-10-CM | POA: Insufficient documentation

## 2021-11-04 DIAGNOSIS — N760 Acute vaginitis: Secondary | ICD-10-CM | POA: Diagnosis not present

## 2021-11-04 DIAGNOSIS — R319 Hematuria, unspecified: Secondary | ICD-10-CM | POA: Diagnosis not present

## 2021-11-04 DIAGNOSIS — B9689 Other specified bacterial agents as the cause of diseases classified elsewhere: Secondary | ICD-10-CM | POA: Insufficient documentation

## 2021-11-04 DIAGNOSIS — B3731 Acute candidiasis of vulva and vagina: Secondary | ICD-10-CM | POA: Diagnosis not present

## 2021-11-04 DIAGNOSIS — Z113 Encounter for screening for infections with a predominantly sexual mode of transmission: Secondary | ICD-10-CM | POA: Insufficient documentation

## 2021-11-04 LAB — URINALYSIS, ROUTINE W REFLEX MICROSCOPIC
Bilirubin Urine: NEGATIVE
Glucose, UA: NEGATIVE mg/dL
Ketones, ur: NEGATIVE mg/dL
Nitrite: POSITIVE — AB
Protein, ur: >300 mg/dL — AB
Specific Gravity, Urine: 1.025 (ref 1.005–1.030)
pH: 6 (ref 5.0–8.0)

## 2021-11-04 LAB — URINALYSIS, MICROSCOPIC (REFLEX): RBC / HPF: >50 RBC/hpf (ref 0–5)

## 2021-11-04 LAB — WET PREP, GENITAL
Sperm: NONE SEEN
Trich, Wet Prep: NONE SEEN
WBC, Wet Prep HPF POC: <10 (ref <10)

## 2021-11-04 MED ORDER — CEPHALEXIN 500 MG PO CAPS: 500.0000 mg | ORAL_CAPSULE | Freq: Two times a day (BID) | ORAL | 0 refills | Status: DC

## 2021-11-04 MED ORDER — IBUPROFEN 600 MG PO TABS: 600.0000 mg | ORAL_TABLET | Freq: Four times a day (QID) | ORAL | 0 refills | Status: DC | PRN

## 2021-11-04 MED ORDER — PHENAZOPYRIDINE HCL 200 MG PO TABS: 200.0000 mg | ORAL_TABLET | Freq: Three times a day (TID) | ORAL | 0 refills | Status: DC | PRN

## 2021-11-04 MED ORDER — FLUCONAZOLE 150 MG PO TABS: 150.0000 mg | ORAL_TABLET | Freq: Once | ORAL | 1 refills | Status: AC

## 2021-11-04 MED ORDER — METRONIDAZOLE 500 MG PO TABS: 500.0000 mg | ORAL_TABLET | Freq: Two times a day (BID) | ORAL | 0 refills | Status: AC

## 2021-11-04 NOTE — ED Triage Notes (Signed)
Patient c/o urinary frequency and urgency, burning at the end of urination and lower back pain for 3 days.  Patient denies vaginal discharge but states that she would like STD swab.

## 2021-11-04 NOTE — Discharge Instructions (Signed)
Finish the Keflex, Flagyl and Diflucan.  Pyridium will help with your symptoms.  Will turn your urine orange.  Make sure you drink plenty of extra fluids give [place] a working phone number so that we can contact you if needed. Refrain from sexual contact until all of your labs have come back, symptoms have resolved, and your partner(s) are treated if necessary. Return to the ER if you get worse, have a fever >100.4, or for any concerns.   Go to www.goodrx.com  or www.costplusdrugs.com to look up your medications. This will give you a list of where you can find your prescriptions at the most affordable prices. Or ask the pharmacist what the cash price is, or if they have any other discount programs available to help make your medication more affordable. This can be less expensive than what you would pay with insurance.

## 2021-11-04 NOTE — ED Provider Notes (Signed)
HPI  SUBJECTIVE:  Erin Tucker is a 36 y.o. female who presents with 3 days of dysuria, urgency, frequency.  She notes very faint pink on the toilet paper when she wipes.  No gross hematuria.  She reports body aches, but states that these have resolved and now has bilateral low back pain.  No odorous urine, cloudy urine, nausea, vomiting, fevers, other abdominal, pelvic pain.  No vaginal odor, bleeding, discharge, genital rash or itching.  She is in a long-term monogamous relationship with a female, who is asymptomatic.  STDs are not particularly a concern today, but would like to check as it has been a while since being tested.  No antibiotics in the past month.  No antipyretic in the past 6 hours.  She tried increasing her fluid intake and resting without improvement in her symptoms.  No aggravating factors.  She has a past medical history of UTI and pyelonephritis in childhood.  No history of nephrolithiasis.  She also has a history of HSV 1, BV, yeast.  No history of any other STDs, diabetes.  LMP: 9/24.  Denies the possibility of being pregnant.  PCP: None.    Past Medical History:  Diagnosis Date   Chronic back pain    Chronic neck pain    COVID-19 virus infection 12/2018   Fatigue    Food allergy    Herpes genitalis    High cholesterol    History of abnormal cervical Pap smear    Irregular menses    Knee pain    Migraines    Obesity (BMI 35.0-39.9 without comorbidity)    Other specified disorders of thyroid    Pre-diabetes    Swallowing difficulty    Thyroid nodule, cold    Urine incontinence    Vitamin D deficiency     Past Surgical History:  Procedure Laterality Date   CESAREAN SECTION  12/14/2013   THYROIDECTOMY, PARTIAL Right 07/03/2016   follicular adenoma    Family History  Problem Relation Age of Onset   Diabetes Father    Drug abuse Father    High blood pressure Father    Colon cancer Maternal Grandfather    Prostate cancer Maternal Grandfather    Cancer  Maternal Grandfather    Obesity Mother    Asthma Sister    Cancer Brother    Early death Paternal Grandfather    Breast cancer Neg Hx    Ovarian cancer Neg Hx    Heart disease Neg Hx     Social History   Tobacco Use   Smoking status: Never   Smokeless tobacco: Never  Vaping Use   Vaping Use: Never used  Substance Use Topics   Alcohol use: Yes    Comment: rare   Drug use: No     Current Facility-Administered Medications:    etonogestrel (NEXPLANON) implant 68 mg, 68 mg, Subdermal, Once, Copland, Alicia B, PA-C  Current Outpatient Medications:    cephALEXin (KEFLEX) 500 MG capsule, Take 1 capsule (500 mg total) by mouth 2 (two) times daily., Disp: 14 capsule, Rfl: 0   fluconazole (DIFLUCAN) 150 MG tablet, Take 1 tablet (150 mg total) by mouth once for 1 dose. 1 tab po x 1. May repeat in 72 hours if no improvement, Disp: 2 tablet, Rfl: 1   ibuprofen (ADVIL) 600 MG tablet, Take 1 tablet (600 mg total) by mouth every 6 (six) hours as needed., Disp: 30 tablet, Rfl: 0   metroNIDAZOLE (FLAGYL) 500 MG tablet, Take 1 tablet (  500 mg total) by mouth 2 (two) times daily for 7 days., Disp: 14 tablet, Rfl: 0   phenazopyridine (PYRIDIUM) 200 MG tablet, Take 1 tablet (200 mg total) by mouth 3 (three) times daily as needed for pain., Disp: 6 tablet, Rfl: 0   Semaglutide-Weight Management (WEGOVY) 0.5 MG/0.5ML SOAJ, Inject 0.5 mg into the skin once a week., Disp: 2 mL, Rfl: 0   Vitamin D, Ergocalciferol, (DRISDOL) 1.25 MG (50000 UNIT) CAPS capsule, Take 1 capsule (50,000 Units total) by mouth every 7 (seven) days., Disp: 4 capsule, Rfl: 0  Allergies  Allergen Reactions   Mangifera Indica Itching and Swelling   Other Itching    Red Hi-C drink     ROS  As noted in HPI.   Physical Exam  BP 124/79 (BP Location: Right Arm)   Pulse 88   Temp 98.2 F (36.8 C) (Oral)   Resp 14   Ht 5\' 2"  (1.575 m)   Wt 106.1 kg   LMP 10/14/2021 (Approximate)   SpO2 98%   BMI 42.80 kg/m    Constitutional: Well developed, well nourished, no acute distress Eyes:  EOMI, conjunctiva normal bilaterally HENT: Normocephalic, atraumatic,mucus membranes moist Respiratory: Normal inspiratory effort Cardiovascular: Normal rate GI: nondistended.  Soft.  No suprapubic, flank tenderness Back: No CVAT.  No paralumbar tenderness. skin: No rash, skin intact Musculoskeletal: no deformities Neurologic: Alert & oriented x 3, no focal neuro deficits Psychiatric: Speech and behavior appropriate   ED Course   Medications - No data to display  Orders Placed This Encounter  Procedures   Wet prep, genital    Standing Status:   Standing    Number of Occurrences:   1   Urine Culture    Standing Status:   Standing    Number of Occurrences:   1    Order Specific Question:   Indication    Answer:   Dysuria   Urinalysis, Routine w reflex microscopic Urine, Clean Catch    Standing Status:   Standing    Number of Occurrences:   1   Urinalysis, Microscopic (reflex)    Standing Status:   Standing    Number of Occurrences:   1    Results for orders placed or performed during the hospital encounter of 11/04/21 (from the past 24 hour(s))  Urinalysis, Routine w reflex microscopic Urine, Clean Catch     Status: Abnormal   Collection Time: 11/04/21  1:12 PM  Result Value Ref Range   Color, Urine YELLOW YELLOW   APPearance CLOUDY (A) CLEAR   Specific Gravity, Urine 1.025 1.005 - 1.030   pH 6.0 5.0 - 8.0   Glucose, UA NEGATIVE NEGATIVE mg/dL   Hgb urine dipstick MODERATE (A) NEGATIVE   Bilirubin Urine NEGATIVE NEGATIVE   Ketones, ur NEGATIVE NEGATIVE mg/dL   Protein, ur >300 (A) NEGATIVE mg/dL   Nitrite POSITIVE (A) NEGATIVE   Leukocytes,Ua SMALL (A) NEGATIVE  Wet prep, genital     Status: Abnormal   Collection Time: 11/04/21  1:12 PM   Specimen: Vaginal  Result Value Ref Range   Yeast Wet Prep HPF POC PRESENT (A) NONE SEEN   Trich, Wet Prep NONE SEEN NONE SEEN   Clue Cells Wet Prep  HPF POC PRESENT (A) NONE SEEN   WBC, Wet Prep HPF POC <10 <10   Sperm NONE SEEN   Urinalysis, Microscopic (reflex)     Status: Abnormal   Collection Time: 11/04/21  1:12 PM  Result Value Ref Range  RBC / HPF >50 0 - 5 RBC/hpf   WBC, UA 6-10 0 - 5 WBC/hpf   Bacteria, UA MANY (A) NONE SEEN   Squamous Epithelial / LPF 0-5 0 - 5   Budding Yeast PRESENT    Ca Oxalate Crys, UA PRESENT    No results found.  ED Clinical Impression  1. Urinary tract infection with hematuria, site unspecified   2. BV (bacterial vaginosis)   3. Yeast vaginitis   4. Screening for STDs (sexually transmitted diseases)      ED Assessment/Plan     UA consistent with a UTI with positive nitrites, small leukocytes, many bacteria and hematuria, proteinuria.   Will send home with Keflex twice daily for 7 days, Pyridium.  Interestingly enough she also has calcium oxalate crystals in her urine, but no history of nephrolithiasis.  She has no flank, abdominal pain, CVA tenderness.  Doubt obstructing nephrolithiasis at this time.  Deferring imaging.  No evidence of pyelonephritis at this time.  Urine culture sent to confirm antibiotic choice.  She also has BV and a vaginal yeast infection.  Home with Flagyl, Diflucan.  Also prescription of ibuprofen if necessary.  Advised patient to not have intercourse until all symptoms have resolved, all labs have been resulted and she finishes her medications, and she and her partner has been treated if necessary.  ER return precautions given.  We will set her up with a primary care provider prior to discharge.  Discussed labs,  MDM, treatment plan, and plan for follow-up with patient. Discussed sn/sx that should prompt return to the ED. patient agrees with plan.   Meds ordered this encounter  Medications   phenazopyridine (PYRIDIUM) 200 MG tablet    Sig: Take 1 tablet (200 mg total) by mouth 3 (three) times daily as needed for pain.    Dispense:  6 tablet    Refill:  0    cephALEXin (KEFLEX) 500 MG capsule    Sig: Take 1 capsule (500 mg total) by mouth 2 (two) times daily.    Dispense:  14 capsule    Refill:  0   metroNIDAZOLE (FLAGYL) 500 MG tablet    Sig: Take 1 tablet (500 mg total) by mouth 2 (two) times daily for 7 days.    Dispense:  14 tablet    Refill:  0   fluconazole (DIFLUCAN) 150 MG tablet    Sig: Take 1 tablet (150 mg total) by mouth once for 1 dose. 1 tab po x 1. May repeat in 72 hours if no improvement    Dispense:  2 tablet    Refill:  1   ibuprofen (ADVIL) 600 MG tablet    Sig: Take 1 tablet (600 mg total) by mouth every 6 (six) hours as needed.    Dispense:  30 tablet    Refill:  0      *This clinic note was created using Scientist, clinical (histocompatibility and immunogenetics). Therefore, there may be occasional mistakes despite careful proofreading.  ?    Domenick Gong, MD 11/04/21 [phone]

## 2021-11-07 LAB — CERVICOVAGINAL ANCILLARY ONLY
Chlamydia: POSITIVE — AB
Comment: NEGATIVE
Comment: NORMAL
Neisseria Gonorrhea: NEGATIVE

## 2021-11-07 LAB — URINE CULTURE: Culture: >=100000 — AB

## 2021-11-08 ENCOUNTER — Telehealth: Payer: 59 | Admitting: Family Medicine

## 2021-11-08 DIAGNOSIS — A749 Chlamydial infection, unspecified: Secondary | ICD-10-CM

## 2021-11-08 MED ORDER — DOXYCYCLINE HYCLATE 100 MG PO TABS: 100.0000 mg | ORAL_TABLET | Freq: Two times a day (BID) | ORAL | 0 refills | Status: AC

## 2021-11-08 NOTE — Progress Notes (Signed)
Virtual Visit Consent   [name], you are scheduled for a virtual visit with a [facility] provider today. Just as with appointments in the office, your consent must be obtained to participate. Your consent will be active for this visit and any virtual visit you may have with one of our providers in the next 365 days. If you have a MyChart account, a copy of this consent can be sent to you electronically.  As this is a virtual visit, video technology does not allow for your provider to perform a traditional examination. This may limit your provider's ability to fully assess your condition. If your provider identifies any concerns that need to be evaluated in person or the need to arrange testing (such as labs, EKG, etc.), we will make arrangements to do so. Although advances in technology are sophisticated, we cannot ensure that it will always work on either your end or our end. If the connection with a video visit is poor, the visit may have to be switched to a telephone visit. With either a video or telephone visit, we are not always able to ensure that we have a secure connection.  By engaging in this virtual visit, you consent to the provision of healthcare and authorize for your insurance to be billed (if applicable) for the services provided during this visit. Depending on your insurance coverage, you may receive a charge related to this service.  I need to obtain your verbal consent now. Are you willing to proceed with your visit today? [name] has provided verbal consent on 11/08/2021 for a virtual visit (video or telephone). Perlie Mayo, NP  Date: 11/08/2021 11:30 AM  Virtual Visit via Video Note   I, Perlie Mayo, connected with  Erin Tucker  ([id number], [birth year]) on 11/08/21 at 11:30 AM EDT by a video-enabled telemedicine application and verified that I am speaking with the correct person using two identifiers.  Location: Patient: Virtual Visit Location Patient:  Home Provider: Virtual Visit Location Provider: Home Office   I discussed the limitations of evaluation and management by telemedicine and the availability of in person appointments. The patient expressed understanding and agreed to proceed.    History of Present Illness: Erin Tucker is a 36 y.o. who identifies as a female who was assigned female at birth, and is being seen today for need of treatment for STD found on screening during UTI visit to UC on 11/04/21 note in Epic. She is also finishing treatment for yeast, BV, and UTI- tolerating medications well and no issues or concerns at this time. Has let partner know finding and as refrained from intercourse since treatment of BV started.  Denies any new S&S, concerns. Denies fevers, chills, pelvic pain or UTI symptoms Denies discharge, itching or burning.   Problems:  Patient Active Problem List   Diagnosis Date Noted   Other hyperlipidemia 03/15/2020   Class 2 drug-induced obesity with serious comorbidity and body mass index (BMI) of 38.0 to 38.9 in adult 12/31/2019   Insulin resistance 11/13/2019   Vitamin D deficiency 10/16/2019   Need for Tdap vaccination 09/02/2019   Preventative health care 08/21/2019   BMI 40.0-44.9, adult ([place]) 08/21/2019   Obesity (BMI 35.0-39.9 without comorbidity)    Thyroid nodule, cold    Class 2 severe obesity with serious comorbidity and body mass index (BMI) of 39.0 to 39.9 in adult [facility]) 03/07/2016   Subclinical hypothyroidism 02/03/2014    Allergies:  Allergies  Allergen Reactions  Mangifera Indica Itching and Swelling   Other Itching    Red Hi-C drink   Medications:  Current Outpatient Medications:    cephALEXin (KEFLEX) 500 MG capsule, Take 1 capsule (500 mg total) by mouth 2 (two) times daily., Disp: 14 capsule, Rfl: 0   ibuprofen (ADVIL) 600 MG tablet, Take 1 tablet (600 mg total) by mouth every 6 (six) hours as needed., Disp: 30 tablet, Rfl: 0   metroNIDAZOLE (FLAGYL) 500 MG tablet,  Take 1 tablet (500 mg total) by mouth 2 (two) times daily for 7 days., Disp: 14 tablet, Rfl: 0   phenazopyridine (PYRIDIUM) 200 MG tablet, Take 1 tablet (200 mg total) by mouth 3 (three) times daily as needed for pain., Disp: 6 tablet, Rfl: 0   Semaglutide-Weight Management (WEGOVY) 0.5 MG/0.5ML SOAJ, Inject 0.5 mg into the skin once a week., Disp: 2 mL, Rfl: 0   Vitamin D, Ergocalciferol, (DRISDOL) 1.25 MG (50000 UNIT) CAPS capsule, Take 1 capsule (50,000 Units total) by mouth every 7 (seven) days., Disp: 4 capsule, Rfl: 0  Current Facility-Administered Medications:    etonogestrel (NEXPLANON) implant 68 mg, 68 mg, Subdermal, Once, Copland, Alicia B, PA-C  Observations/Objective: Patient is well-developed, well-nourished in no acute distress.  Resting comfortably at home.  Head is normocephalic, atraumatic.  No labored breathing.  Speech is clear and coherent with logical content.  Patient is alert and oriented at baseline.    Assessment and Plan:  1. Chlamydia infection  - doxycycline (VIBRA-TABS) 100 MG tablet; Take 1 tablet (100 mg total) by mouth 2 (two) times daily for 7 days.  Dispense: 14 tablet; Refill: 0  -incidental finding from UTI visit to UC on 11/04/21 note in epic -treatment ordered -no signs or symptoms as of today. -pt finishing treatment of yeast, BV, and UTI   Reviewed side effects, risks and benefits of medication.    Patient acknowledged agreement and understanding of the plan.   Past Medical, Surgical, Social History, Allergies, and Medications have been Reviewed.    Follow Up Instructions: I discussed the assessment and treatment plan with the patient. The patient was provided an opportunity to ask questions and all were answered. The patient agreed with the plan and demonstrated an understanding of the instructions.  A copy of instructions were sent to the patient via MyChart unless otherwise noted below.    The patient was advised to call back or seek  an in-person evaluation if the symptoms worsen or if the condition fails to improve as anticipated.  Time:  I spent 10 minutes with the patient via telehealth technology discussing the above problems/concerns.    Freddy Finner, NP

## 2021-11-08 NOTE — Patient Instructions (Signed)
Erin Tucker, thank you for joining Perlie Mayo, NP for today's virtual visit.  While this provider is not your primary care provider (PCP), if your PCP is located in our provider database this encounter information will be shared with them immediately following your visit.  Consent: (Patient) Erin Tucker provided verbal consent for this virtual visit at the beginning of the encounter.  Current Medications:  Current Outpatient Medications:    doxycycline (VIBRA-TABS) 100 MG tablet, Take 1 tablet (100 mg total) by mouth 2 (two) times daily for 7 days., Disp: 14 tablet, Rfl: 0   cephALEXin (KEFLEX) 500 MG capsule, Take 1 capsule (500 mg total) by mouth 2 (two) times daily., Disp: 14 capsule, Rfl: 0   ibuprofen (ADVIL) 600 MG tablet, Take 1 tablet (600 mg total) by mouth every 6 (six) hours as needed., Disp: 30 tablet, Rfl: 0   metroNIDAZOLE (FLAGYL) 500 MG tablet, Take 1 tablet (500 mg total) by mouth 2 (two) times daily for 7 days., Disp: 14 tablet, Rfl: 0   phenazopyridine (PYRIDIUM) 200 MG tablet, Take 1 tablet (200 mg total) by mouth 3 (three) times daily as needed for pain., Disp: 6 tablet, Rfl: 0   Semaglutide-Weight Management (WEGOVY) 0.5 MG/0.5ML SOAJ, Inject 0.5 mg into the skin once a week., Disp: 2 mL, Rfl: 0   Vitamin D, Ergocalciferol, (DRISDOL) 1.25 MG (50000 UNIT) CAPS capsule, Take 1 capsule (50,000 Units total) by mouth every 7 (seven) days., Disp: 4 capsule, Rfl: 0  Current Facility-Administered Medications:    etonogestrel (NEXPLANON) implant 68 mg, 68 mg, Subdermal, Once, Copland, Alicia B, PA-C   Medications ordered in this encounter:  Meds ordered this encounter  Medications   doxycycline (VIBRA-TABS) 100 MG tablet    Sig: Take 1 tablet (100 mg total) by mouth 2 (two) times daily for 7 days.    Dispense:  14 tablet    Refill:  0    Order Specific Question:   Supervising Provider    Answer:   Chase Picket [id number]     *If you need refills on other  medications prior to your next appointment, please contact your pharmacy*  Follow-Up: Call back or seek an in-person evaluation if the symptoms worsen or if the condition fails to improve as anticipated.  [place] [phone]  Other Instructions Chlamydia, Female Chlamydia is a sexually transmitted infection (STI). This infection spreads through sexual contact. The infection can grow in: The urethra. This is the part of the body that drains pee (urine) from the bladder. The cervix. This is the lowest part of the womb (uterus). The throat. The opening of the butt (rectum). This condition is not hard to treat. But if it is not treated, it can cause worse health problems. You may have a higher risk of not being able to have children. Also, if you are pregnant or get pregnant and have untreated chlamydia: It can cause serious problems during pregnancy. It can spread to your baby during delivery and cause your baby to have health problems. What are the causes?  This condition is caused by a germ (bacteria) called Chlamydia trachomatis. These germs are spread from an infected partner during sex. The infection can spread through contact with the genitals, mouth, or the opening of the butt (rectum). What increases the risk? Not using a condom the right way. Not using a condom every time you have sex. Having a new sex partner. Having more than one sex partner. Being  sexually active before age 60. What are the signs or symptoms? In some cases, there are no symptoms, especially early in the illness. If you get symptoms, they may include: Peeing often, or a burning feeling when you pee. Redness, soreness, or swelling of the vagina or butt. Fluid (discharge) coming from the vagina or butt. Pain the belly (abdomen). Pain during sex. Bleeding between monthly periods or irregular periods. How is this treated? This condition is treated with antibiotic medicines. Follow these  instructions at home: Sexual activity Tell your sex partner or partners about your infection. Sex partners are people you had oral, anal, or vaginal sex with within 60 days of when you started getting sick. They need treatment even if they do not feel or seem sick. Do not have sex until: You and your sex partners have been treated. Your doctor says it is okay. If you get just one dose of medicine, wait at least 7 days before having sex. General instructions Take over-the-counter and prescription medicines as told by your doctor. Finish your antibiotics even if you start to feel better. It is up to you to get your test results. Ask how to get your results when they are ready. Keep all follow-up visits. You may need tests after 3 months. How is this prevented? To lower your risk: Use latex or polyurethane condoms the right way. Do this every time you have sex. Do not have many sex partners. Ask if your sex partner got tested for STIs and had negative results. Get regular health screenings to check for STIs. Contact a doctor if: You get new symptoms. Your symptoms are getting worse or do not get better with treatment. You have a fever or chills. You have pain during sex. Your periods are irregular. You bleed between periods or after sex. You get flu-like symptoms. These may be: Night sweats. Sore throat. Muscle aches. You are unable to take your antibiotic medicine as prescribed. Summary Chlamydia is an infection that spreads through sexual contact. This condition is treated with antibiotics. If it is not treated, it can cause health problems. Your sex partners will also need to be treated. Do not have sex until both you and your partner have been treated. Take all medicines as told and keep all follow-up visits. This information is not intended to replace advice given to you by your health care provider. Make sure you discuss any questions you have with your health care  provider. Document Revised: 11/08/2020 Document Reviewed: 11/08/2020 Elsevier Patient Education  2023 Elsevier Inc.    If you have been instructed to have an in-person evaluation today at a local Urgent Care facility, please use the link below. It will take you to a list of all of our available [facility], including address, phone number and hours of operation. Please do not delay care.  [facility]  If you or a family member do not have a primary care provider, use the link below to schedule a visit and establish care. When you choose a [facility] primary care physician or advanced practice provider, you gain a long-term partner in health. Find a Primary Care Provider  Learn more about [facility]'s in-office and virtual care options:  - Get Care Now

## 2021-11-30 DIAGNOSIS — M9902 Segmental and somatic dysfunction of thoracic region: Secondary | ICD-10-CM | POA: Diagnosis not present

## 2021-11-30 DIAGNOSIS — M9905 Segmental and somatic dysfunction of pelvic region: Secondary | ICD-10-CM | POA: Diagnosis not present

## 2021-11-30 DIAGNOSIS — M546 Pain in thoracic spine: Secondary | ICD-10-CM | POA: Diagnosis not present

## 2021-11-30 DIAGNOSIS — M9901 Segmental and somatic dysfunction of cervical region: Secondary | ICD-10-CM | POA: Diagnosis not present

## 2021-11-30 DIAGNOSIS — M9903 Segmental and somatic dysfunction of lumbar region: Secondary | ICD-10-CM | POA: Diagnosis not present

## 2021-11-30 DIAGNOSIS — M5137 Other intervertebral disc degeneration, lumbosacral region: Secondary | ICD-10-CM | POA: Diagnosis not present

## 2021-11-30 DIAGNOSIS — M531 Cervicobrachial syndrome: Secondary | ICD-10-CM | POA: Diagnosis not present

## 2021-11-30 DIAGNOSIS — M955 Acquired deformity of pelvis: Secondary | ICD-10-CM | POA: Diagnosis not present

## 2021-12-01 DIAGNOSIS — M9902 Segmental and somatic dysfunction of thoracic region: Secondary | ICD-10-CM | POA: Diagnosis not present

## 2021-12-01 DIAGNOSIS — M9901 Segmental and somatic dysfunction of cervical region: Secondary | ICD-10-CM | POA: Diagnosis not present

## 2021-12-01 DIAGNOSIS — M546 Pain in thoracic spine: Secondary | ICD-10-CM | POA: Diagnosis not present

## 2021-12-01 DIAGNOSIS — M955 Acquired deformity of pelvis: Secondary | ICD-10-CM | POA: Diagnosis not present

## 2021-12-01 DIAGNOSIS — M9905 Segmental and somatic dysfunction of pelvic region: Secondary | ICD-10-CM | POA: Diagnosis not present

## 2021-12-01 DIAGNOSIS — M531 Cervicobrachial syndrome: Secondary | ICD-10-CM | POA: Diagnosis not present

## 2021-12-01 DIAGNOSIS — M9903 Segmental and somatic dysfunction of lumbar region: Secondary | ICD-10-CM | POA: Diagnosis not present

## 2021-12-01 DIAGNOSIS — M5137 Other intervertebral disc degeneration, lumbosacral region: Secondary | ICD-10-CM | POA: Diagnosis not present

## 2021-12-05 DIAGNOSIS — M9905 Segmental and somatic dysfunction of pelvic region: Secondary | ICD-10-CM | POA: Diagnosis not present

## 2021-12-05 DIAGNOSIS — M9902 Segmental and somatic dysfunction of thoracic region: Secondary | ICD-10-CM | POA: Diagnosis not present

## 2021-12-05 DIAGNOSIS — M5137 Other intervertebral disc degeneration, lumbosacral region: Secondary | ICD-10-CM | POA: Diagnosis not present

## 2021-12-05 DIAGNOSIS — M955 Acquired deformity of pelvis: Secondary | ICD-10-CM | POA: Diagnosis not present

## 2021-12-05 DIAGNOSIS — M9903 Segmental and somatic dysfunction of lumbar region: Secondary | ICD-10-CM | POA: Diagnosis not present

## 2021-12-05 DIAGNOSIS — M9901 Segmental and somatic dysfunction of cervical region: Secondary | ICD-10-CM | POA: Diagnosis not present

## 2021-12-05 DIAGNOSIS — M546 Pain in thoracic spine: Secondary | ICD-10-CM | POA: Diagnosis not present

## 2021-12-05 DIAGNOSIS — M531 Cervicobrachial syndrome: Secondary | ICD-10-CM | POA: Diagnosis not present

## 2021-12-07 DIAGNOSIS — M9905 Segmental and somatic dysfunction of pelvic region: Secondary | ICD-10-CM | POA: Diagnosis not present

## 2021-12-07 DIAGNOSIS — M9901 Segmental and somatic dysfunction of cervical region: Secondary | ICD-10-CM | POA: Diagnosis not present

## 2021-12-07 DIAGNOSIS — M5137 Other intervertebral disc degeneration, lumbosacral region: Secondary | ICD-10-CM | POA: Diagnosis not present

## 2021-12-07 DIAGNOSIS — M955 Acquired deformity of pelvis: Secondary | ICD-10-CM | POA: Diagnosis not present

## 2021-12-07 DIAGNOSIS — M546 Pain in thoracic spine: Secondary | ICD-10-CM | POA: Diagnosis not present

## 2021-12-07 DIAGNOSIS — M531 Cervicobrachial syndrome: Secondary | ICD-10-CM | POA: Diagnosis not present

## 2021-12-07 DIAGNOSIS — M9902 Segmental and somatic dysfunction of thoracic region: Secondary | ICD-10-CM | POA: Diagnosis not present

## 2021-12-07 DIAGNOSIS — M9903 Segmental and somatic dysfunction of lumbar region: Secondary | ICD-10-CM | POA: Diagnosis not present

## 2021-12-12 DIAGNOSIS — M531 Cervicobrachial syndrome: Secondary | ICD-10-CM | POA: Diagnosis not present

## 2021-12-12 DIAGNOSIS — M9901 Segmental and somatic dysfunction of cervical region: Secondary | ICD-10-CM | POA: Diagnosis not present

## 2021-12-12 DIAGNOSIS — M5137 Other intervertebral disc degeneration, lumbosacral region: Secondary | ICD-10-CM | POA: Diagnosis not present

## 2021-12-12 DIAGNOSIS — M9902 Segmental and somatic dysfunction of thoracic region: Secondary | ICD-10-CM | POA: Diagnosis not present

## 2021-12-12 DIAGNOSIS — M955 Acquired deformity of pelvis: Secondary | ICD-10-CM | POA: Diagnosis not present

## 2021-12-12 DIAGNOSIS — M9905 Segmental and somatic dysfunction of pelvic region: Secondary | ICD-10-CM | POA: Diagnosis not present

## 2021-12-12 DIAGNOSIS — M9903 Segmental and somatic dysfunction of lumbar region: Secondary | ICD-10-CM | POA: Diagnosis not present

## 2021-12-12 DIAGNOSIS — M546 Pain in thoracic spine: Secondary | ICD-10-CM | POA: Diagnosis not present

## 2021-12-13 ENCOUNTER — Ambulatory Visit (INDEPENDENT_AMBULATORY_CARE_PROVIDER_SITE_OTHER): Payer: 59 | Admitting: Family Medicine

## 2021-12-13 ENCOUNTER — Encounter: Payer: Self-pay | Admitting: Family Medicine

## 2021-12-13 VITALS — BP 120/84 | HR 73 | Ht 61.0 in | Wt 235.0 lb

## 2021-12-13 DIAGNOSIS — Z7689 Persons encountering health services in other specified circumstances: Secondary | ICD-10-CM

## 2021-12-13 DIAGNOSIS — Z91018 Allergy to other foods: Secondary | ICD-10-CM

## 2021-12-13 DIAGNOSIS — R400 Somnolence: Secondary | ICD-10-CM | POA: Diagnosis not present

## 2021-12-13 DIAGNOSIS — G4733 Obstructive sleep apnea (adult) (pediatric): Secondary | ICD-10-CM | POA: Insufficient documentation

## 2021-12-13 MED ORDER — TOPIRAMATE 50 MG PO TABS: ORAL_TABLET | ORAL | 0 refills | Status: DC

## 2021-12-13 NOTE — Progress Notes (Signed)
     Primary Care / Sports Medicine Office Visit  Patient Information:  Patient ID: DIVINE IMBER, female DOB: 1986/01/14 Age: 36 y.o. MRN: [id number]   Erin Tucker is a pleasant 36 y.o. female presenting with the following:  Chief Complaint  Patient presents with   Establish Care    Vitals:   12/13/21 1339  BP: 120/84  Pulse: 73  SpO2: 99%   Vitals:   12/13/21 1339  Weight: 235 lb (106.6 kg)  Height: 5\' 1"  (1.549 m)   Body mass index is 44.4 kg/m.  No results found.   Independent interpretation of notes and tests performed by another provider:   None  Procedures performed:   None  Pertinent History, Exam, Impression, and Recommendations:   Problem List Items Addressed This Visit       Other   Daytime somnolence - Primary    Chronic condition, denies sonorous sleep, lack of restful sleep, daytime somnolence, chronic fatigue.  Risk stratification labs to be ordered, home sleep study ordered as well.      Encounter for weight management    Patient has been aggressively working on lifestyle modifications with diet and exercise, has previously been established with medical weight management but no significant benefit from semaglutide due to severe anorexia at 0.5 mg dose.  In addition to risk stratification labs to be ordered at follow-up physical, plan for OSA evaluation, initiation of topiramate 25 mg x 2 weeks then 50 mg daily.      Relevant Medications   topiramate (TOPAMAX) 50 MG tablet   Multiple food allergies    Chronic condition, noted intolerance to red meat, mango, denies anaphylaxis, no work-up to date.  Referral to allergist placed, food avoidance advised over the interim.      Relevant Orders   Ambulatory referral to Allergy     Orders & Medications Meds ordered this encounter  Medications   topiramate (TOPAMAX) 50 MG tablet    Sig: Take 0.5 tablets (25 mg total) by mouth daily for 14 days, THEN 1 tablet (50 mg total) daily.     Dispense:  60 tablet    Refill:  0   Orders Placed This Encounter  Procedures   Ambulatory referral to Allergy     Return in about 6 weeks (around 01/24/2022) for CPE.     Montel Culver, MD   Primary Care Sports Medicine [facility]

## 2021-12-13 NOTE — Assessment & Plan Note (Signed)
>>  ASSESSMENT AND PLAN FOR DAYTIME SOMNOLENCE WRITTEN ON 12/13/2021  2:26 PM BY Mylinda Brook, Ocie Bob, MD  Chronic condition, denies sonorous sleep, lack of restful sleep, daytime somnolence, chronic fatigue.  Risk stratification labs to be ordered, home sleep study ordered as well.

## 2021-12-13 NOTE — Assessment & Plan Note (Signed)
Chronic condition, denies sonorous sleep, lack of restful sleep, daytime somnolence, chronic fatigue.  Risk stratification labs to be ordered, home sleep study ordered as well.

## 2021-12-13 NOTE — Assessment & Plan Note (Signed)
Chronic condition, noted intolerance to red meat, mango, denies anaphylaxis, no work-up to date.  Referral to allergist placed, food avoidance advised over the interim.

## 2021-12-13 NOTE — Assessment & Plan Note (Signed)
Patient has been aggressively working on lifestyle modifications with diet and exercise, has previously been established with medical weight management but no significant benefit from semaglutide due to severe anorexia at 0.5 mg dose.  In addition to risk stratification labs to be ordered at follow-up physical, plan for OSA evaluation, initiation of topiramate 25 mg x 2 weeks then 50 mg daily.

## 2021-12-13 NOTE — Patient Instructions (Addendum)
-   Referral coordinator will contact you to schedule visit with allergist - Home sleep study ordered, will be contacted with next steps - Start topiramate, half tab daily (25 mg) x14 days then 1 tablet daily (50 mg) until follow-up - Contact our office a few days prior to follow-up to obtain lab orders - Return for follow-up in 6 weeks for annual physical

## 2021-12-14 DIAGNOSIS — G4733 Obstructive sleep apnea (adult) (pediatric): Secondary | ICD-10-CM

## 2021-12-14 HISTORY — DX: Obstructive sleep apnea (adult) (pediatric): G47.33

## 2021-12-19 DIAGNOSIS — M955 Acquired deformity of pelvis: Secondary | ICD-10-CM | POA: Diagnosis not present

## 2021-12-19 DIAGNOSIS — M531 Cervicobrachial syndrome: Secondary | ICD-10-CM | POA: Diagnosis not present

## 2021-12-19 DIAGNOSIS — M546 Pain in thoracic spine: Secondary | ICD-10-CM | POA: Diagnosis not present

## 2021-12-19 DIAGNOSIS — M5137 Other intervertebral disc degeneration, lumbosacral region: Secondary | ICD-10-CM | POA: Diagnosis not present

## 2021-12-19 DIAGNOSIS — M9905 Segmental and somatic dysfunction of pelvic region: Secondary | ICD-10-CM | POA: Diagnosis not present

## 2021-12-19 DIAGNOSIS — M9903 Segmental and somatic dysfunction of lumbar region: Secondary | ICD-10-CM | POA: Diagnosis not present

## 2021-12-19 DIAGNOSIS — M9901 Segmental and somatic dysfunction of cervical region: Secondary | ICD-10-CM | POA: Diagnosis not present

## 2021-12-19 DIAGNOSIS — M9902 Segmental and somatic dysfunction of thoracic region: Secondary | ICD-10-CM | POA: Diagnosis not present

## 2021-12-29 DIAGNOSIS — M955 Acquired deformity of pelvis: Secondary | ICD-10-CM | POA: Diagnosis not present

## 2021-12-29 DIAGNOSIS — M9901 Segmental and somatic dysfunction of cervical region: Secondary | ICD-10-CM | POA: Diagnosis not present

## 2021-12-29 DIAGNOSIS — M9903 Segmental and somatic dysfunction of lumbar region: Secondary | ICD-10-CM | POA: Diagnosis not present

## 2021-12-29 DIAGNOSIS — M5137 Other intervertebral disc degeneration, lumbosacral region: Secondary | ICD-10-CM | POA: Diagnosis not present

## 2021-12-29 DIAGNOSIS — M546 Pain in thoracic spine: Secondary | ICD-10-CM | POA: Diagnosis not present

## 2021-12-29 DIAGNOSIS — M531 Cervicobrachial syndrome: Secondary | ICD-10-CM | POA: Diagnosis not present

## 2021-12-29 DIAGNOSIS — M9902 Segmental and somatic dysfunction of thoracic region: Secondary | ICD-10-CM | POA: Diagnosis not present

## 2021-12-29 DIAGNOSIS — M9905 Segmental and somatic dysfunction of pelvic region: Secondary | ICD-10-CM | POA: Diagnosis not present

## 2022-01-07 DIAGNOSIS — G4730 Sleep apnea, unspecified: Secondary | ICD-10-CM | POA: Diagnosis not present

## 2022-01-11 ENCOUNTER — Encounter: Payer: Self-pay | Admitting: Family Medicine

## 2022-01-11 NOTE — Telephone Encounter (Signed)
Please review.  KP

## 2022-01-12 NOTE — Telephone Encounter (Signed)
Please advise, order for [facility]

## 2022-01-12 NOTE — Telephone Encounter (Signed)
l °

## 2022-01-14 ENCOUNTER — Other Ambulatory Visit: Payer: Self-pay | Admitting: Family Medicine

## 2022-01-14 DIAGNOSIS — E559 Vitamin D deficiency, unspecified: Secondary | ICD-10-CM

## 2022-01-14 DIAGNOSIS — Z0000 Encounter for general adult medical examination without abnormal findings: Secondary | ICD-10-CM

## 2022-01-14 DIAGNOSIS — E038 Other specified hypothyroidism: Secondary | ICD-10-CM

## 2022-01-14 DIAGNOSIS — E88819 Insulin resistance, unspecified: Secondary | ICD-10-CM

## 2022-01-14 DIAGNOSIS — E7849 Other hyperlipidemia: Secondary | ICD-10-CM

## 2022-01-19 ENCOUNTER — Other Ambulatory Visit: Payer: Self-pay

## 2022-01-19 ENCOUNTER — Other Ambulatory Visit
Admission: RE | Admit: 2022-01-19 | Discharge: 2022-01-19 | Disposition: A | Discharge status: Home / Self Care | Payer: 59 | Attending: Family Medicine | Admitting: Family Medicine

## 2022-01-19 ENCOUNTER — Telehealth: Payer: Self-pay | Admitting: Family Medicine

## 2022-01-19 DIAGNOSIS — E038 Other specified hypothyroidism: Secondary | ICD-10-CM | POA: Diagnosis not present

## 2022-01-19 DIAGNOSIS — E559 Vitamin D deficiency, unspecified: Secondary | ICD-10-CM | POA: Diagnosis not present

## 2022-01-19 DIAGNOSIS — E7849 Other hyperlipidemia: Secondary | ICD-10-CM

## 2022-01-19 DIAGNOSIS — Z0000 Encounter for general adult medical examination without abnormal findings: Secondary | ICD-10-CM | POA: Diagnosis not present

## 2022-01-19 DIAGNOSIS — E88819 Insulin resistance, unspecified: Secondary | ICD-10-CM | POA: Diagnosis not present

## 2022-01-19 LAB — CBC
HCT: 39.5 % (ref 36.0–46.0)
Hemoglobin: 13 g/dL (ref 12.0–15.0)
MCH: 27.7 pg (ref 26.0–34.0)
MCHC: 32.9 g/dL (ref 30.0–36.0)
MCV: 84.2 fL (ref 80.0–100.0)
Platelets: 336 10*3/uL (ref 150–400)
RBC: 4.69 MIL/uL (ref 3.87–5.11)
RDW: 14.3 % (ref 11.5–15.5)
WBC: 6.1 10*3/uL (ref 4.0–10.5)
nRBC: 0 % (ref 0.0–0.2)

## 2022-01-19 LAB — COMPREHENSIVE METABOLIC PANEL
ALT: 17 U/L (ref 0–44)
AST: 15 U/L (ref 15–41)
Albumin: 4.1 g/dL (ref 3.5–5.0)
Alkaline Phosphatase: 72 U/L (ref 38–126)
Anion gap: 5 (ref 5–15)
BUN: 17 mg/dL (ref 6–20)
CO2: 22 mmol/L (ref 22–32)
Calcium: 8.9 mg/dL (ref 8.9–10.3)
Chloride: 109 mmol/L (ref 98–111)
Creatinine, Ser: 1.05 mg/dL — ABNORMAL HIGH (ref 0.44–1.00)
GFR, Estimated: >60 mL/min (ref >60)
Glucose, Bld: 95 mg/dL (ref 70–99)
Potassium: 3.8 mmol/L (ref 3.5–5.1)
Sodium: 136 mmol/L (ref 135–145)
Total Bilirubin: 0.6 mg/dL (ref 0.3–1.2)
Total Protein: 8 g/dL (ref 6.5–8.1)

## 2022-01-19 LAB — T4, FREE: Free T4: 0.57 ng/dL — ABNORMAL LOW (ref 0.61–1.12)

## 2022-01-19 LAB — LIPID PANEL
Cholesterol: 235 mg/dL — ABNORMAL HIGH (ref 0–200)
HDL: 56 mg/dL (ref >40)
LDL Cholesterol: 160 mg/dL — ABNORMAL HIGH (ref 0–99)
Total CHOL/HDL Ratio: 4.2 RATIO
Triglycerides: 95 mg/dL (ref <150)
VLDL: 19 mg/dL (ref 0–40)

## 2022-01-19 LAB — TSH: TSH: 2.802 u[IU]/mL (ref 0.350–4.500)

## 2022-01-19 LAB — VITAMIN D 25 HYDROXY (VIT D DEFICIENCY, FRACTURES): Vit D, 25-Hydroxy: 28.3 ng/mL — ABNORMAL LOW (ref 30–100)

## 2022-01-19 NOTE — Telephone Encounter (Signed)
Copied from CRM [id number]. Topic: General - Inquiry >> Jan 19, 2022 11:43 AM De Blanch wrote: Reason for CRM: Cierra from registration at [facility] stated pt went in for labs; however, she did not see any lab orders now that she sees them; the orders have been discontinued or canceled.  Please advise.

## 2022-01-20 LAB — HEMOGLOBIN A1C
Hgb A1c MFr Bld: 5.8 % — ABNORMAL HIGH (ref 4.8–5.6)
Mean Plasma Glucose: 120 mg/dL

## 2022-01-21 LAB — T3, FREE: T3, Free: 2.7 pg/mL (ref 2.0–4.4)

## 2022-01-24 ENCOUNTER — Encounter: Payer: Self-pay | Admitting: Family Medicine

## 2022-01-24 ENCOUNTER — Ambulatory Visit (INDEPENDENT_AMBULATORY_CARE_PROVIDER_SITE_OTHER): Payer: 59 | Admitting: Family Medicine

## 2022-01-24 VITALS — BP 120/80 | HR 68 | Ht 62.0 in | Wt 230.0 lb

## 2022-01-24 DIAGNOSIS — R7303 Prediabetes: Secondary | ICD-10-CM

## 2022-01-24 DIAGNOSIS — E038 Other specified hypothyroidism: Secondary | ICD-10-CM

## 2022-01-24 DIAGNOSIS — Z7689 Persons encountering health services in other specified circumstances: Secondary | ICD-10-CM

## 2022-01-24 DIAGNOSIS — Z0000 Encounter for general adult medical examination without abnormal findings: Secondary | ICD-10-CM | POA: Diagnosis not present

## 2022-01-24 DIAGNOSIS — G4733 Obstructive sleep apnea (adult) (pediatric): Secondary | ICD-10-CM

## 2022-01-24 MED ORDER — TOPIRAMATE 50 MG PO TABS: 50.0000 mg | ORAL_TABLET | Freq: Every day | ORAL | 0 refills | Status: DC

## 2022-01-24 NOTE — Assessment & Plan Note (Signed)
Home sleep study with AHI 3% 8.9 indicating mild sleep apnea, given the fact that she is clinically symptomatic, will coordinate CPAP and follow-up in 3 months.

## 2022-01-24 NOTE — Assessment & Plan Note (Signed)
Has demonstrated 5 pounds of interval weight loss, noting appetite suppression from topiramate 25 mg and subsequent self titration of 50 mg.  I have advised continue to 50 mg topiramate dosing, continued adherence to lifestyle modifications, we will follow-up on this issue in 3 months.  If plateau or further augmentation needed, can consider GLP-1 receptor agonist agents

## 2022-01-24 NOTE — Assessment & Plan Note (Signed)
Annual examination completed, risk stratification labs ordered, anticipatory guidance provided.  We will follow labs once resulted. 

## 2022-01-24 NOTE — Assessment & Plan Note (Signed)
Noted on recent serum studies, lifestyle modifications encouraged, we will track at next annual labs

## 2022-01-24 NOTE — Assessment & Plan Note (Signed)
Recent TFTs demonstrate subclinical hypothyroidism, will follow routinely

## 2022-01-24 NOTE — Progress Notes (Signed)
Annual Physical Exam Visit  Patient Information:  Patient ID: Erin Tucker, female DOB: [birth year] Age: 36 y.o. MRN: [id number]   Subjective:   CC: Annual Physical Exam  HPI:  Erin Tucker is here for their annual physical.  I reviewed the past medical history, family history, social history, surgical history, and allergies today and changes were made as necessary.  Please see the problem list section below for additional details.  Past Medical History: Past Medical History:  Diagnosis Date   Chronic back pain    Chronic neck pain    COVID-19 virus infection 12/2018   Fatigue    Food allergy    Herpes genitalis    High cholesterol    History of abnormal cervical Pap smear    Irregular menses    Knee pain    Migraines    Obesity (BMI 35.0-39.9 without comorbidity)    Other specified disorders of thyroid    Pre-diabetes    Swallowing difficulty    Thyroid nodule, cold    Urine incontinence    Vitamin D deficiency    Past Surgical History: Past Surgical History:  Procedure Laterality Date   CESAREAN SECTION  12/14/2013   THYROIDECTOMY, PARTIAL Right 07/03/2016   follicular adenoma   Family History: Family History  Problem Relation Age of Onset   Diabetes Father    Drug abuse Father    High blood pressure Father    Colon cancer Maternal Grandfather    Prostate cancer Maternal Grandfather    Cancer Maternal Grandfather    Obesity Mother    Asthma Sister    Cancer Brother    Early death Paternal Grandfather    Breast cancer Neg Hx    Ovarian cancer Neg Hx    Heart disease Neg Hx    Allergies: Allergies  Allergen Reactions   Mangifera Indica Itching and Swelling   Other Itching    Red Hi-C drink   Health Maintenance: Health Maintenance  Topic Date Due   COVID-19 Vaccine (1) 12/29/2025 (Originally 06/14/1986)   PAP SMEAR-Modifier  02/25/2023   DTaP/Tdap/Td (9 - Td or Tdap) 08/20/2029   INFLUENZA VACCINE  Completed   Hepatitis C Screening   Completed   HIV Screening  Completed   HPV VACCINES  Aged Out    HM Colonoscopy     This patient has no relevant Health Maintenance data.      Medications: Current Outpatient Medications on File Prior to Visit  Medication Sig Dispense Refill   ibuprofen (ADVIL) 600 MG tablet Take 1 tablet (600 mg total) by mouth every 6 (six) hours as needed. 30 tablet 0   No current facility-administered medications on file prior to visit.    Review of Systems: No headache, visual changes, nausea, vomiting, diarrhea, constipation, dizziness, abdominal pain, skin rash, fevers, chills, night sweats, swollen lymph nodes, weight loss, chest pain, body aches, joint swelling, muscle aches, shortness of breath, +mood changes, visual or auditory hallucinations reported.  Objective:   Vitals:   01/24/22 0928  BP: 120/80  Pulse: 68  SpO2: 99%   Vitals:   01/24/22 0928  Weight: 230 lb (104.3 kg)  Height: 5\' 2"  (1.575 m)   Body mass index is 42.07 kg/m.  General: Well Developed, well nourished, and in no acute distress.  Neuro: Alert and oriented x3, extra-ocular muscles intact, sensation grossly intact. Cranial nerves II through XII are grossly intact, motor, sensory, and coordinative functions are intact. HEENT: Normocephalic, atraumatic, pupils equal  round reactive to light, neck supple, no masses, no lymphadenopathy, thyroid nonpalpable. Oropharynx, nasopharynx, external ear canals are unremarkable. Skin: Warm and dry, no rashes noted.  Cardiac: Regular rate and rhythm, no murmurs rubs or gallops. No peripheral edema. Pulses symmetric. Respiratory: Clear to auscultation bilaterally. Not using accessory muscles, speaking in full sentences.  Abdominal: Soft, nontender, nondistended, positive bowel sounds, no masses, no organomegaly. Musculoskeletal: Shoulder, elbow, wrist, hip, knee, ankle stable, and with full range of motion.  Female chaperone initials: AH present throughout the physical  examination.  Impression and Recommendations:   The patient was counselled, risk factors were discussed, and anticipatory guidance given.  Problem List Items Addressed This Visit       Respiratory   OSA (obstructive sleep apnea)    Home sleep study with AHI 3% 8.9 indicating mild sleep apnea, given the fact that she is clinically symptomatic, will coordinate CPAP and follow-up in 3 months.        Endocrine   Subclinical hypothyroidism    Recent TFTs demonstrate subclinical hypothyroidism, will follow routinely        Other   Encounter for weight management    Has demonstrated 5 pounds of interval weight loss, noting appetite suppression from topiramate 25 mg and subsequent self titration of 50 mg.  I have advised continue to 50 mg topiramate dosing, continued adherence to lifestyle modifications, we will follow-up on this issue in 3 months.  If plateau or further augmentation needed, can consider GLP-1 receptor agonist agents      Relevant Medications   topiramate (TOPAMAX) 50 MG tablet   Annual physical exam - Primary   Prediabetes    Noted on recent serum studies, lifestyle modifications encouraged, we will track at next annual labs        Orders & Medications Medications:  Meds ordered this encounter  Medications   topiramate (TOPAMAX) 50 MG tablet    Sig: Take 1 tablet (50 mg total) by mouth daily.    Dispense:  90 tablet    Refill:  0   No orders of the defined types were placed in this encounter.    Return in about 3 months (around 04/25/2022) for weight check.    Jerrol Banana, MD, [facility]   Primary Care Sports Medicine Primary Care and Sports Medicine at [facility]

## 2022-01-24 NOTE — Patient Instructions (Addendum)
-   Review information provided - Attend eye doctor annually, dentist every 6 months, work towards or maintain 30 minutes of moderate intensity physical activity at least 5 days per week, and consume a balanced diet - Continue topiramate 50 mg daily - Return in 3 months - Contact us for any questions between now and then

## 2022-01-25 ENCOUNTER — Encounter: Payer: Self-pay | Admitting: Family Medicine

## 2022-01-25 ENCOUNTER — Other Ambulatory Visit: Payer: Self-pay

## 2022-01-25 ENCOUNTER — Other Ambulatory Visit (HOSPITAL_BASED_OUTPATIENT_CLINIC_OR_DEPARTMENT_OTHER): Payer: Self-pay

## 2022-01-25 DIAGNOSIS — Z7689 Persons encountering health services in other specified circumstances: Secondary | ICD-10-CM

## 2022-01-25 MED ORDER — TOPIRAMATE 50 MG PO TABS
50.0000 mg | ORAL_TABLET | Freq: Every day | ORAL | 0 refills | Status: DC
  Filled 2022-01-25 – 2022-02-19 (×2): qty 90, 90d supply, fill #0

## 2022-01-25 MED ORDER — TOPIRAMATE 50 MG PO TABS
ORAL_TABLET | ORAL | 0 refills | Status: DC
  Filled 2022-01-25: qty 30, 30d supply, fill #0

## 2022-02-10 ENCOUNTER — Encounter: Payer: Self-pay | Admitting: Family Medicine

## 2022-02-20 ENCOUNTER — Other Ambulatory Visit: Payer: Self-pay

## 2022-03-02 DIAGNOSIS — G4733 Obstructive sleep apnea (adult) (pediatric): Secondary | ICD-10-CM | POA: Diagnosis not present

## 2022-03-20 DIAGNOSIS — G4733 Obstructive sleep apnea (adult) (pediatric): Secondary | ICD-10-CM | POA: Diagnosis not present

## 2022-04-02 DIAGNOSIS — G4733 Obstructive sleep apnea (adult) (pediatric): Secondary | ICD-10-CM | POA: Diagnosis not present

## 2022-04-18 DIAGNOSIS — G4733 Obstructive sleep apnea (adult) (pediatric): Secondary | ICD-10-CM | POA: Diagnosis not present

## 2022-04-25 ENCOUNTER — Encounter: Payer: Self-pay | Admitting: Family Medicine

## 2022-04-25 ENCOUNTER — Other Ambulatory Visit: Payer: Self-pay

## 2022-04-25 ENCOUNTER — Ambulatory Visit: Payer: Commercial Managed Care - PPO | Admitting: Family Medicine

## 2022-04-25 VITALS — BP 128/64 | HR 74 | Ht 62.0 in | Wt 222.0 lb

## 2022-04-25 DIAGNOSIS — Z7689 Persons encountering health services in other specified circumstances: Secondary | ICD-10-CM | POA: Diagnosis not present

## 2022-04-25 DIAGNOSIS — G4733 Obstructive sleep apnea (adult) (pediatric): Secondary | ICD-10-CM | POA: Diagnosis not present

## 2022-04-25 MED ORDER — BUPROPION HCL ER (XL) 150 MG PO TB24
150.0000 mg | ORAL_TABLET | Freq: Every day | ORAL | 1 refills | Status: DC
  Filled 2022-04-25: qty 30, 30d supply, fill #0
  Filled 2022-06-07: qty 30, 30d supply, fill #1

## 2022-04-25 MED ORDER — TOPIRAMATE 50 MG PO TABS
50.0000 mg | ORAL_TABLET | Freq: Every day | ORAL | 0 refills | Status: DC
  Filled 2022-04-25 – 2022-06-07 (×2): qty 90, 90d supply, fill #0

## 2022-04-25 NOTE — Patient Instructions (Addendum)
-   Continue moderate exercise on a regular basis, aim for 5 days/week - Recommend "muscle confusion", involving varying exercises throughout your workout week - Continue topiramate daily - Start bupropion daily - Contact us in 1 month if wishing to further increase bupropion - Return for follow-up in 2 months

## 2022-04-25 NOTE — Progress Notes (Signed)
     Primary Care / Sports Medicine Office Visit  Patient Information:  Patient ID: Erin Tucker, female DOB: 1985/04/27 Age: 37 y.o. MRN: [id number]   ROBERTO HLAVATY is a pleasant 37 y.o. female presenting with the following:  Chief Complaint  Patient presents with   Weight Check    Vitals:   04/25/22 1035  BP: 128/64  Pulse: 74  SpO2: 99%   Vitals:   04/25/22 1035  Weight: 222 lb (100.7 kg)  Height: 5\' 2"  (1.575 m)   Body mass index is 40.6 kg/m.  No results found.   Independent interpretation of notes and tests performed by another provider:   None  Procedures performed:   None  Pertinent History, Exam, Impression, and Recommendations:   Erin Tucker was seen today for weight check.  OSA (obstructive sleep apnea) Overview: HSS 01/07/2022 AHI 3% 8.9 (mild sleep apnea)  Assessment & Plan: Patient has been compliant with CPAP, noting benefit from usage, encouraged continued use of CPAP, can even utilize for sedentary activities for mask acclamation.   Encounter for weight management Assessment & Plan: Patient has been steadily losing weight tolerating topiramate 50 mg without issues.  Adjunct diet and exercise modifications.  Given her steady weight loss we discussed various strategies for further optimization inclusive of adjunct metformin and/or bupropion, exercise optimization strategies, etc.  Patient has opted to utilize bupropion 150 mg daily in addition to continued dosing of topiramate 50 mg.  She can contact us in 1 month if tolerating bupropion well and wishing to further titrate, otherwise 91-month follow-up scheduled.  Orders: -     Topiramate; Take 1 tablet (50 mg total) by mouth daily.  Dispense: 90 tablet; Refill: 0 -     buPROPion HCl ER (XL); Take 1 tablet (150 mg total) by mouth daily.  Dispense: 30 tablet; Refill: 1     Orders & Medications Meds ordered this encounter  Medications   topiramate (TOPAMAX) 50 MG tablet    Sig: Take 1  tablet (50 mg total) by mouth daily.    Dispense:  90 tablet    Refill:  0   buPROPion (WELLBUTRIN XL) 150 MG 24 hr tablet    Sig: Take 1 tablet (150 mg total) by mouth daily.    Dispense:  30 tablet    Refill:  1   No orders of the defined types were placed in this encounter.    Return in about 2 months (around 06/25/2022).     Montel Culver, MD, [facility]   Primary Care Sports Medicine Primary Care and Sports Medicine at [facility]

## 2022-04-25 NOTE — Assessment & Plan Note (Signed)
Patient has been steadily losing weight tolerating topiramate 50 mg without issues.  Adjunct diet and exercise modifications.  Given her steady weight loss we discussed various strategies for further optimization inclusive of adjunct metformin and/or bupropion, exercise optimization strategies, etc.  Patient has opted to utilize bupropion 150 mg daily in addition to continued dosing of topiramate 50 mg.  She can contact us in 1 month if tolerating bupropion well and wishing to further titrate, otherwise 110-monthfollow-up scheduled.

## 2022-04-25 NOTE — Assessment & Plan Note (Signed)
Patient has been compliant with CPAP, noting benefit from usage, encouraged continued use of CPAP, can even utilize for sedentary activities for mask acclamation.

## 2022-05-01 DIAGNOSIS — G4733 Obstructive sleep apnea (adult) (pediatric): Secondary | ICD-10-CM | POA: Diagnosis not present

## 2022-05-19 DIAGNOSIS — G4733 Obstructive sleep apnea (adult) (pediatric): Secondary | ICD-10-CM | POA: Diagnosis not present

## 2022-06-01 DIAGNOSIS — G4733 Obstructive sleep apnea (adult) (pediatric): Secondary | ICD-10-CM | POA: Diagnosis not present

## 2022-06-07 ENCOUNTER — Other Ambulatory Visit: Payer: Self-pay

## 2022-06-07 ENCOUNTER — Encounter: Payer: Self-pay | Admitting: Family Medicine

## 2022-06-07 NOTE — Telephone Encounter (Signed)
fyi

## 2022-06-19 DIAGNOSIS — G4733 Obstructive sleep apnea (adult) (pediatric): Secondary | ICD-10-CM | POA: Diagnosis not present

## 2022-06-26 ENCOUNTER — Other Ambulatory Visit: Payer: Self-pay

## 2022-06-26 ENCOUNTER — Ambulatory Visit: Payer: Commercial Managed Care - PPO | Admitting: Family Medicine

## 2022-06-26 VITALS — BP 122/78 | HR 76 | Ht 62.0 in | Wt 218.0 lb

## 2022-06-26 DIAGNOSIS — Z7689 Persons encountering health services in other specified circumstances: Secondary | ICD-10-CM

## 2022-06-26 MED ORDER — BUPROPION HCL ER (XL) 300 MG PO TB24
300.0000 mg | ORAL_TABLET | Freq: Every day | ORAL | 0 refills | Status: DC
  Filled 2022-06-26 – 2022-07-14 (×2): qty 90, 90d supply, fill #0

## 2022-06-26 NOTE — Assessment & Plan Note (Signed)
Further weight loss noted, tolerating medication well. Discussed options, plan as follows: - Titrate bupropion to 300 mg - Continue topiramate 50 mg - Return in 3 months, interim refills OK

## 2022-06-26 NOTE — Patient Instructions (Signed)
-   Take new dose of bupropion (Wellbutrin) 300 mg daily - Continue topiramate 50 mg daily - Return in 3 months, contact for refills, questions

## 2022-06-26 NOTE — Progress Notes (Signed)
     Primary Care / Sports Medicine Office Visit  Patient Information:  Patient ID: Erin Tucker, female DOB: 03/28/1985 Age: 37 y.o. MRN: [id number]   DICK BURDGE is a pleasant 37 y.o. female presenting with the following:  Chief Complaint  Patient presents with   Weight Check    Vitals:   06/26/22 0839  BP: 122/78  Pulse: 76  SpO2: 100%   Vitals:   06/26/22 0839  Weight: 218 lb (98.9 kg)  Height: 5\' 2"  (1.575 m)   Body mass index is 39.87 kg/m.  No results found.   Independent interpretation of notes and tests performed by another provider:   None  Procedures performed:   None  Pertinent History, Exam, Impression, and Recommendations:   Erin Tucker was seen today for weight check.  Encounter for weight management Assessment & Plan: Further weight loss noted, tolerating medication well. Discussed options, plan as follows: - Titrate bupropion to 300 mg - Continue topiramate 50 mg - Return in 3 months, interim refills OK  Orders: -     buPROPion HCl ER (XL); Take 1 tablet (300 mg total) by mouth daily.  Dispense: 90 tablet; Refill: 0     Orders & Medications Meds ordered this encounter  Medications   buPROPion (WELLBUTRIN XL) 300 MG 24 hr tablet    Sig: Take 1 tablet (300 mg total) by mouth daily.    Dispense:  90 tablet    Refill:  0   No orders of the defined types were placed in this encounter.    No follow-ups on file.     Jerrol Banana, MD, [facility]   Primary Care Sports Medicine Primary Care and Sports Medicine at [facility]

## 2022-07-01 DIAGNOSIS — G4733 Obstructive sleep apnea (adult) (pediatric): Secondary | ICD-10-CM | POA: Diagnosis not present

## 2022-07-14 ENCOUNTER — Other Ambulatory Visit: Payer: Self-pay

## 2022-07-20 DIAGNOSIS — G4733 Obstructive sleep apnea (adult) (pediatric): Secondary | ICD-10-CM | POA: Diagnosis not present

## 2022-08-01 DIAGNOSIS — G4733 Obstructive sleep apnea (adult) (pediatric): Secondary | ICD-10-CM | POA: Diagnosis not present

## 2022-08-18 ENCOUNTER — Encounter: Payer: Self-pay | Admitting: Family Medicine

## 2022-08-18 ENCOUNTER — Ambulatory Visit (INDEPENDENT_AMBULATORY_CARE_PROVIDER_SITE_OTHER): Payer: Commercial Managed Care - PPO | Admitting: Family Medicine

## 2022-08-18 ENCOUNTER — Other Ambulatory Visit (HOSPITAL_COMMUNITY)
Admission: RE | Admit: 2022-08-18 | Discharge: 2022-08-18 | Disposition: A | Discharge status: Home / Self Care | Payer: Commercial Managed Care - PPO | Source: Ambulatory Visit | Attending: Family Medicine | Admitting: Family Medicine

## 2022-08-18 VITALS — BP 126/81 | HR 76 | Wt 216.6 lb

## 2022-08-18 DIAGNOSIS — Z01419 Encounter for gynecological examination (general) (routine) without abnormal findings: Secondary | ICD-10-CM | POA: Diagnosis not present

## 2022-08-18 DIAGNOSIS — Z124 Encounter for screening for malignant neoplasm of cervix: Secondary | ICD-10-CM

## 2022-08-18 DIAGNOSIS — N898 Other specified noninflammatory disorders of vagina: Secondary | ICD-10-CM | POA: Diagnosis not present

## 2022-08-18 NOTE — Progress Notes (Signed)
Patient presents for Annual.  LMP: 08/01/22 Last pap: Date: 02/25/20 Contraception: Condoms  Mammogram: Not yet indicated STD Screening: Accepts Flu Vaccine : N/A  CC:  Annual pap and also wants to be tested for BV

## 2022-08-18 NOTE — Progress Notes (Signed)
Subjective:     Erin Tucker is a 37 y.o. female and is here for a comprehensive physical exam. The patient reports problems - see below . Cycles are 5-7 days, seem to be heavy x 3 days. Reports fishy smell to urine and cloudy, concerned she has BV.  The following portions of the patient's history were reviewed and updated as appropriate: allergies, current medications, past family history, past medical history, past social history, past surgical history, and problem list.  Review of Systems Pertinent items noted in HPI and remainder of comprehensive ROS otherwise negative.   Objective:  Chaperone present for exam   BP 126/81   Pulse 76   Wt 216 lb 9.6 oz (98.2 kg)   LMP 08/01/2022 (Approximate)   BMI 39.62 kg/m  General appearance: alert, cooperative, and appears stated age Head: Normocephalic, without obvious abnormality, atraumatic Neck: no adenopathy, supple, symmetrical, trachea midline, and thyroid not enlarged, symmetric, no tenderness/mass/nodules Lungs: clear to auscultation bilaterally Breasts: normal appearance, no masses or tenderness Heart: regular rate and rhythm, S1, S2 normal, no murmur, click, rub or gallop Abdomen: soft, non-tender; bowel sounds normal; no masses,  no organomegaly Pelvic: cervix normal in appearance, external genitalia normal, no adnexal masses or tenderness, no cervical motion tenderness, uterus normal size, shape, and consistency, and vagina normal without discharge Extremities: extremities normal, atraumatic, no cyanosis or edema Pulses: 2+ and symmetric Skin: Skin color, texture, turgor normal. No rashes or lesions Lymph nodes: Cervical, supraclavicular, and axillary nodes normal. Neurologic: Grossly normal    Assessment:    Healthy female exam.      Plan:  Screening for malignant neoplasm of cervix - 76283 - Plan: Cytology - PAP  Encounter for gynecological examination without abnormal finding  Vaginal odor - 15176 - Check wet prep  and treat accordingly - Plan: Cervicovaginal ancillary only    See After Visit Summary for Counseling Recommendations

## 2022-08-18 NOTE — Patient Instructions (Signed)

## 2022-08-21 LAB — CERVICOVAGINAL ANCILLARY ONLY
Bacterial Vaginitis (gardnerella): NEGATIVE
Candida Glabrata: NEGATIVE
Candida Vaginitis: NEGATIVE
Comment: NEGATIVE
Comment: NEGATIVE
Comment: NEGATIVE

## 2022-08-22 LAB — CYTOLOGY - PAP
Adequacy: ABSENT
Chlamydia: NEGATIVE
Comment: NEGATIVE
Comment: NEGATIVE
Comment: NEGATIVE
Comment: NORMAL
Diagnosis: NEGATIVE
High risk HPV: NEGATIVE
Neisseria Gonorrhea: NEGATIVE
Trichomonas: NEGATIVE

## 2022-08-31 DIAGNOSIS — G4733 Obstructive sleep apnea (adult) (pediatric): Secondary | ICD-10-CM | POA: Diagnosis not present

## 2022-09-07 ENCOUNTER — Other Ambulatory Visit: Payer: Self-pay

## 2022-09-07 MED ORDER — HYDROCODONE-ACETAMINOPHEN 5-325 MG PO TABS
1.0000 | ORAL_TABLET | Freq: Four times a day (QID) | ORAL | 0 refills | Status: DC | PRN
  Filled 2022-09-07: qty 9, 5d supply, fill #0

## 2022-09-07 MED ORDER — CHLORHEXIDINE GLUCONATE 0.12 % MT SOLN
15.0000 mL | Freq: Two times a day (BID) | OROMUCOSAL | 0 refills | Status: DC
  Filled 2022-09-07: qty 473, 16d supply, fill #0

## 2022-09-25 ENCOUNTER — Ambulatory Visit: Payer: Commercial Managed Care - PPO | Admitting: Family Medicine

## 2022-09-25 ENCOUNTER — Other Ambulatory Visit: Payer: Self-pay

## 2022-09-25 ENCOUNTER — Encounter: Payer: Self-pay | Admitting: Family Medicine

## 2022-09-25 DIAGNOSIS — Z7689 Persons encountering health services in other specified circumstances: Secondary | ICD-10-CM

## 2022-09-25 MED ORDER — TOPIRAMATE 50 MG PO TABS
50.0000 mg | ORAL_TABLET | Freq: Two times a day (BID) | ORAL | 0 refills | Status: DC
Start: 2022-09-25 — End: 2023-08-09
  Filled 2022-09-25 – 2022-10-19 (×2): qty 180, 90d supply, fill #0

## 2022-09-25 MED ORDER — BUPROPION HCL ER (XL) 300 MG PO TB24
300.0000 mg | ORAL_TABLET | Freq: Every day | ORAL | 0 refills | Status: DC
Start: 2022-09-25 — End: 2023-08-09
  Filled 2022-09-25 – 2022-10-19 (×2): qty 90, 90d supply, fill #0

## 2022-09-25 NOTE — Progress Notes (Signed)
     Primary Care / Sports Medicine Office Visit  Patient Information:  Patient ID: Erin Tucker, female DOB: 20-Jul-1985 Age: 37 y.o. MRN: [id number]   Erin Tucker is a pleasant 37 y.o. female presenting with the following:  Chief Complaint  Patient presents with   Weight Loss    Down 3 pounds    Vitals:   09/25/22 0748  BP: 120/78  Pulse: 78  SpO2: 97%   Vitals:   09/25/22 0748  Weight: 213 lb (96.6 kg)  Height: 5\' 2"  (1.575 m)   Body mass index is 38.96 kg/m.  No results found.   Independent interpretation of notes and tests performed by another provider:   None  Procedures performed:   None  Pertinent History, Exam, Impression, and Recommendations:   Erin Tucker was seen today for weight loss.  Encounter for weight management Overview:    09/25/2022    7:48 AM 08/18/2022    9:39 AM 06/26/2022    8:39 AM  Vitals with BMI  Height 5\' 2"   5\' 2"   Weight 213 lbs 216 lbs 10 oz 218 lbs  BMI 38.95  39.86  Systolic 120 126 403  Diastolic 78 81 78  Pulse 78 76 76     Assessment & Plan: Patient continues to demonstrate interval weight loss and describes no issues tolerating Wellbutrin 300 mg and topiramate 50 mg daily. She has been using the medications as an adjunct to healthy lifestyle changes.  We discussed her excellent progress and possible methods to further enhance weight loss.  Plan: - Increase topiramate to 50 mg twice daily (anticipate response in 1-2 weeks with maximal response at the 8-12 week mark) - Continue bupropion 300 mg daily - Continue healthy lifestyle changes from a diet & exercise standpoint - Return in 4 months for annual physical  Orders: -     Topiramate; Take 1 tablet (50 mg total) by mouth 2 (two) times daily.  Dispense: 240 tablet; Refill: 0 -     buPROPion HCl ER (XL); Take 1 tablet (300 mg total) by mouth daily.  Dispense: 120 tablet; Refill: 0     Orders & Medications Meds ordered this encounter  Medications    topiramate (TOPAMAX) 50 MG tablet    Sig: Take 1 tablet (50 mg total) by mouth 2 (two) times daily.    Dispense:  240 tablet    Refill:  0   buPROPion (WELLBUTRIN XL) 300 MG 24 hr tablet    Sig: Take 1 tablet (300 mg total) by mouth daily.    Dispense:  120 tablet    Refill:  0   No orders of the defined types were placed in this encounter.    No follow-ups on file.     Jerrol Banana, MD, [facility]   Primary Care Sports Medicine Primary Care and Sports Medicine at [facility]

## 2022-09-25 NOTE — Patient Instructions (Signed)
-   Increase topiramate to 50 mg twice daily (anticipate response in 1-2 weeks with maximal response at the 8-12 week mark) - Continue bupropion 300 mg daily - Continue healthy lifestyle changes from a diet & exercise standpoint - Return in 4 months for annual physical

## 2022-09-25 NOTE — Assessment & Plan Note (Signed)
Patient continues to demonstrate interval weight loss and describes no issues tolerating Wellbutrin 300 mg and topiramate 50 mg daily. She has been using the medications as an adjunct to healthy lifestyle changes.  We discussed her excellent progress and possible methods to further enhance weight loss.  Plan: - Increase topiramate to 50 mg twice daily (anticipate response in 1-2 weeks with maximal response at the 8-12 week mark) - Continue bupropion 300 mg daily - Continue healthy lifestyle changes from a diet & exercise standpoint - Return in 4 months for annual physical

## 2022-10-11 ENCOUNTER — Other Ambulatory Visit: Payer: Self-pay

## 2022-10-19 ENCOUNTER — Other Ambulatory Visit: Payer: Self-pay

## 2022-11-01 DIAGNOSIS — G4733 Obstructive sleep apnea (adult) (pediatric): Secondary | ICD-10-CM | POA: Diagnosis not present

## 2022-11-25 ENCOUNTER — Telehealth: Payer: Commercial Managed Care - PPO | Admitting: Physician Assistant

## 2022-11-25 DIAGNOSIS — R3989 Other symptoms and signs involving the genitourinary system: Secondary | ICD-10-CM

## 2022-11-25 DIAGNOSIS — N898 Other specified noninflammatory disorders of vagina: Secondary | ICD-10-CM | POA: Diagnosis not present

## 2022-11-25 MED ORDER — FLUCONAZOLE 150 MG PO TABS
ORAL_TABLET | ORAL | 0 refills | Status: DC
Start: 2022-11-25 — End: 2023-01-26
  Filled 2022-11-25: qty 2, 3d supply, fill #0

## 2022-11-25 MED ORDER — NITROFURANTOIN MONOHYD MACRO 100 MG PO CAPS
100.0000 mg | ORAL_CAPSULE | Freq: Two times a day (BID) | ORAL | 0 refills | Status: AC
Start: 2022-11-25 — End: 2022-12-03
  Filled 2022-11-25: qty 10, 5d supply, fill #0

## 2022-11-25 NOTE — Progress Notes (Signed)
E-Visit for Urinary Problems  We are sorry that you are not feeling well.  Here is how we plan to help!  Based on what you shared with me it looks like you most likely have a simple urinary tract infection.  A UTI (Urinary Tract Infection) is a bacterial infection of the bladder.  Most cases of urinary tract infections are simple to treat but a key part of your care is to encourage you to drink plenty of fluids and watch your symptoms carefully.  I have prescribed MacroBid 100 mg twice a day for 5 days.  Your symptoms should gradually improve. Call us if the burning in your urine worsens, you develop worsening fever, back pain or pelvic pain or if your symptoms do not resolve after completing the antibiotic.  Urinary tract infections can be prevented by drinking plenty of water to keep your body hydrated.  Also be sure when you wipe, wipe from front to back and don't hold it in!  If possible, empty your bladder every 4 hours.  I've also sent a prescription, Diflucan(Fluconazole), for vaginal discharge. Please take this as prescribed. If symptoms worsen then I recommend having a face to face visit and be tested for urine.   HOME CARE Drink plenty of fluids Compete the full course of the antibiotics even if the symptoms resolve Remember, when you need to go.go. Holding in your urine can increase the likelihood of getting a UTI! GET HELP RIGHT AWAY IF: You cannot urinate You get a high fever Worsening back pain occurs You see blood in your urine You feel sick to your stomach or throw up You feel like you are going to pass out  MAKE SURE YOU  Understand these instructions. Will watch your condition. Will get help right away if you are not doing well or get worse.   Thank you for choosing an e-visit.  Your e-visit answers were reviewed by a board certified advanced clinical practitioner to complete your personal care plan. Depending upon the condition, your plan could have included  both over the counter or prescription medications.  Please review your pharmacy choice. Make sure the pharmacy is open so you can pick up prescription now. If there is a problem, you may contact your provider through [employer] and have the prescription routed to another pharmacy.  Your safety is important to [place]. If you have drug allergies check your prescription carefully.   For the next 24 hours you can use MyChart to ask questions about today's visit, request a non-urgent call back, or ask for a work or school excuse. You will get an email in the next two days asking about your experience. I hope that your e-visit has been valuable and will speed your recovery.   I have spent 5 minutes in review of e-visit questionnaire, review and updating patient chart, medical decision making and response to patient.   Gilberto Better, PA-C

## 2022-11-26 ENCOUNTER — Other Ambulatory Visit: Payer: Self-pay

## 2022-12-04 ENCOUNTER — Telehealth: Payer: Commercial Managed Care - PPO

## 2022-12-04 DIAGNOSIS — A6004 Herpesviral vulvovaginitis: Secondary | ICD-10-CM

## 2022-12-05 ENCOUNTER — Other Ambulatory Visit: Payer: Self-pay

## 2022-12-05 MED ORDER — VALACYCLOVIR HCL 500 MG PO TABS
500.0000 mg | ORAL_TABLET | Freq: Two times a day (BID) | ORAL | 0 refills | Status: AC
Start: 2022-12-05 — End: 2022-12-08
  Filled 2022-12-05: qty 6, 3d supply, fill #0

## 2022-12-05 NOTE — Progress Notes (Signed)
E-Visit for Herpes Simplex  We are sorry that you are not feeling well.  Here is how we plan to help!  Based on what you have shared ith me, it looks like you may be having an outbreak/flare-up of genital herpes.    I have prescribed I have prescribed Valacyclovir 500 mg Take one by mouth twice a day for 3 days.    If you have been prescribed long term medications to be taken on a regular basis, it is important to follow the recommendations and take them as ordered.    Outbreaks usually include blisters and open sores in the genital area. Outbreaks that happen after the first time are usually not as severe and do not last as long. Genital Herpes Simplex is a commonly sexually transmitted viral infection that is found worldwide. Most of these genital infections are caused by one or two herpes simplex viruses that is passed from person to person during vaginal, oral, or anal sex. Sometimes, people do not know they have herpes because they do not have any symptoms.  Please be aware that if you have genital herpes you can be contagious even when you are not having rash or flare-up and you may not have any symptoms, even when you are taking suppressive medicines.  Herpes cannot be cured. The disease usually causes most problems during the first few years. After that, the virus is still there, but it causes few to no symptoms. Even when the virus is active, people with herpes can take medicines to reduce and help prevent symptoms.  Herpes is an infection that can cause blisters and open sores on the genital area. Herpes is caused by a virus that is passed from person to person during vaginal, oral, or anal sex. Sometimes, people do not know they have herpes because they do not have any symptoms. Herpes cannot be cured. The disease usually causes most problems during the first few years. After that, the virus is still there, but it causes few to no symptoms. Even when the virus is active, people with herpes  can take medicines to reduce and help prevent symptoms.  If you have been prescribed medications to be taken on a regular basis, it is important to follow the recommendations and take them as ordered.  Some people with herpes never have any symptoms. But other people can develop symptoms within a few weeks of being infected with the herpes virus   Symptoms usually include blisters in the genital area. In women, this area includes the vagina, buttocks, anus, or thighs. In men, this area includes the penis, scrotum, anus, butt, or thighs. The blisters can become painful open sores, which then crust over as they heal. Sometimes, people can have other symptoms that include:  ?Blisters on the mouth or lips ?Fever, headache, or pain in the joints ?Trouble urinating  Outbreaks might occur every month or more often, or just once or twice a year. Sometimes, people can tell when an outbreak will occur, because they feel itching or pain beforehand. Sometimes they do not know that an outbreak is coming because they have no symptoms. Whatever your pattern is, keep in mind that herpes outbreaks usually become less frequent over time as you get older. Certain things, called "triggers," can make outbreaks more likely to occur. These include stress, sunlight, menstrual periods,or getting sick.  Antiviral therapy can shorten the duration of symptoms and signs in primary infection, which, when untreated, can be associated with significant increase in the   symptoms of the disease.  HOME CARE Use a portable bath (such as a "Sitz bath") where you can sit in warm water for about 20 minutes. Your bathtub could also work. Avoid bubble baths.  Keep the genital area clean and dry and avoid tight clothes.  Take over-the-counter pain medicine such as acetaminophen (brand name: Tylenol) or ibuprofen sample brand names: Advil, Motrin). But avoid aspirin.  Only take medications as instructed by your medical team.  You are  most likely to spread herpes to a sex partner when you have blisters and open sores on your body. But it's also possible to spread herpes to your partner when you do not have any symptoms. That is because herpes can be present on your body without causing any symptoms, like blisters or pain.  Telling your sex partner that you have herpes can be hard. But it can help protect them, since there are ways to lower the risk of spreading the infection.   Using a condom every time you have sex  Not having sex when you have symptoms  Not having oral sex if you have blisters or open sores (in the genital area or around your mouth)  MAKE SURE YOU   Understand these instructions. Do not have sex without using a condom until you have been seen by a doctor and as instructed by the provider If you are not better or improved within 7 days, you MUST have a follow up at your doctor or the health department for evaluation. There are other causes of rashes in the genital region.  Thank you for choosing an e-visit.  Your e-visit answers were reviewed by a board certified advanced clinical practitioner to complete your personal care plan. Depending upon the condition, your plan could have included both over the counter or prescription medications.  Please review your pharmacy choice. Make sure the pharmacy is open so you can pick up prescription now. If there is a problem, you may contact your provider through MyChart messaging and have the prescription routed to another pharmacy.  Your safety is important to us. If you have drug allergies check your prescription carefully.   For the next 24 hours you can use MyChart to ask questions about today's visit, request a non-urgent call back, or ask for a work or school excuse. You will get an email in the next two days asking about your experience. I hope that your e-visit has been valuable and will speed your recovery.      

## 2022-12-05 NOTE — Progress Notes (Signed)
I have spent 5 minutes in review of e-visit questionnaire, review and updating patient chart, medical decision making and response to patient.   Mia Milan Cody Jacklynn Dehaas, PA-C    

## 2022-12-08 DIAGNOSIS — G4733 Obstructive sleep apnea (adult) (pediatric): Secondary | ICD-10-CM | POA: Diagnosis not present

## 2023-01-08 DIAGNOSIS — G4733 Obstructive sleep apnea (adult) (pediatric): Secondary | ICD-10-CM | POA: Diagnosis not present

## 2023-01-18 ENCOUNTER — Encounter: Payer: Self-pay | Admitting: Family Medicine

## 2023-01-22 ENCOUNTER — Other Ambulatory Visit: Payer: Self-pay | Admitting: Family Medicine

## 2023-01-22 DIAGNOSIS — Z131 Encounter for screening for diabetes mellitus: Secondary | ICD-10-CM

## 2023-01-22 DIAGNOSIS — E88819 Insulin resistance, unspecified: Secondary | ICD-10-CM

## 2023-01-22 DIAGNOSIS — R7303 Prediabetes: Secondary | ICD-10-CM

## 2023-01-22 DIAGNOSIS — E7849 Other hyperlipidemia: Secondary | ICD-10-CM

## 2023-01-22 DIAGNOSIS — E559 Vitamin D deficiency, unspecified: Secondary | ICD-10-CM

## 2023-01-22 DIAGNOSIS — Z0000 Encounter for general adult medical examination without abnormal findings: Secondary | ICD-10-CM

## 2023-01-22 DIAGNOSIS — E038 Other specified hypothyroidism: Secondary | ICD-10-CM

## 2023-01-23 ENCOUNTER — Other Ambulatory Visit
Admission: RE | Admit: 2023-01-23 | Discharge: 2023-01-23 | Disposition: A | Discharge status: Home / Self Care | Payer: Commercial Managed Care - PPO | Attending: Family Medicine | Admitting: Family Medicine

## 2023-01-23 DIAGNOSIS — R7303 Prediabetes: Secondary | ICD-10-CM | POA: Diagnosis present

## 2023-01-23 DIAGNOSIS — Z131 Encounter for screening for diabetes mellitus: Secondary | ICD-10-CM | POA: Diagnosis not present

## 2023-01-23 DIAGNOSIS — E559 Vitamin D deficiency, unspecified: Secondary | ICD-10-CM | POA: Diagnosis not present

## 2023-01-23 DIAGNOSIS — E7849 Other hyperlipidemia: Secondary | ICD-10-CM | POA: Diagnosis not present

## 2023-01-23 DIAGNOSIS — E88819 Insulin resistance, unspecified: Secondary | ICD-10-CM | POA: Insufficient documentation

## 2023-01-23 DIAGNOSIS — E038 Other specified hypothyroidism: Secondary | ICD-10-CM | POA: Diagnosis not present

## 2023-01-23 LAB — COMPREHENSIVE METABOLIC PANEL
ALT: 33 U/L (ref 0–44)
AST: 30 U/L (ref 15–41)
Albumin: 3.9 g/dL (ref 3.5–5.0)
Alkaline Phosphatase: 81 U/L (ref 38–126)
Anion gap: 9 (ref 5–15)
BUN: 22 mg/dL — ABNORMAL HIGH (ref 6–20)
CO2: 21 mmol/L — ABNORMAL LOW (ref 22–32)
Calcium: 9.1 mg/dL (ref 8.9–10.3)
Chloride: 108 mmol/L (ref 98–111)
Creatinine, Ser: 1.39 mg/dL — ABNORMAL HIGH (ref 0.44–1.00)
GFR, Estimated: 50 mL/min — ABNORMAL LOW (ref >60)
Glucose, Bld: 97 mg/dL (ref 70–99)
Potassium: 3.6 mmol/L (ref 3.5–5.1)
Sodium: 138 mmol/L (ref 135–145)
Total Bilirubin: 0.8 mg/dL (ref <1.2)
Total Protein: 7.3 g/dL (ref 6.5–8.1)

## 2023-01-23 LAB — CBC
HCT: 38.7 % (ref 36.0–46.0)
Hemoglobin: 12.7 g/dL (ref 12.0–15.0)
MCH: 28.2 pg (ref 26.0–34.0)
MCHC: 32.8 g/dL (ref 30.0–36.0)
MCV: 86 fL (ref 80.0–100.0)
Platelets: 358 10*3/uL (ref 150–400)
RBC: 4.5 MIL/uL (ref 3.87–5.11)
RDW: 13.7 % (ref 11.5–15.5)
WBC: 4.9 10*3/uL (ref 4.0–10.5)
nRBC: 0 % (ref 0.0–0.2)

## 2023-01-23 LAB — LIPID PANEL
Cholesterol: 254 mg/dL — ABNORMAL HIGH (ref 0–200)
HDL: 55 mg/dL (ref >40)
LDL Cholesterol: 182 mg/dL — ABNORMAL HIGH (ref 0–99)
Total CHOL/HDL Ratio: 4.6 {ratio}
Triglycerides: 85 mg/dL (ref <150)
VLDL: 17 mg/dL (ref 0–40)

## 2023-01-23 LAB — HEMOGLOBIN A1C
Hgb A1c MFr Bld: 5.4 % (ref 4.8–5.6)
Mean Plasma Glucose: 108.28 mg/dL

## 2023-01-23 LAB — TSH: TSH: 2.614 u[IU]/mL (ref 0.350–4.500)

## 2023-01-23 LAB — VITAMIN D 25 HYDROXY (VIT D DEFICIENCY, FRACTURES): Vit D, 25-Hydroxy: 64.63 ng/mL (ref 30–100)

## 2023-01-23 LAB — T4, FREE: Free T4: 0.64 ng/dL (ref 0.61–1.12)

## 2023-01-24 LAB — T3, FREE: T3, Free: 2.4 pg/mL (ref 2.0–4.4)

## 2023-01-25 ENCOUNTER — Other Ambulatory Visit: Payer: Self-pay | Admitting: Family Medicine

## 2023-01-25 DIAGNOSIS — N1831 Chronic kidney disease, stage 3a: Secondary | ICD-10-CM

## 2023-01-26 ENCOUNTER — Ambulatory Visit (INDEPENDENT_AMBULATORY_CARE_PROVIDER_SITE_OTHER): Payer: Commercial Managed Care - PPO | Admitting: Family Medicine

## 2023-01-26 ENCOUNTER — Encounter: Payer: Self-pay | Admitting: Family Medicine

## 2023-01-26 ENCOUNTER — Other Ambulatory Visit
Admission: RE | Admit: 2023-01-26 | Discharge: 2023-01-26 | Disposition: A | Discharge status: Home / Self Care | Payer: Commercial Managed Care - PPO | Attending: Family Medicine | Admitting: Family Medicine

## 2023-01-26 VITALS — BP 112/64 | HR 75 | Ht 62.0 in | Wt 203.2 lb

## 2023-01-26 DIAGNOSIS — N1831 Chronic kidney disease, stage 3a: Secondary | ICD-10-CM | POA: Diagnosis not present

## 2023-01-26 DIAGNOSIS — E7849 Other hyperlipidemia: Secondary | ICD-10-CM | POA: Diagnosis not present

## 2023-01-26 DIAGNOSIS — G4733 Obstructive sleep apnea (adult) (pediatric): Secondary | ICD-10-CM

## 2023-01-26 DIAGNOSIS — Z0000 Encounter for general adult medical examination without abnormal findings: Secondary | ICD-10-CM | POA: Diagnosis not present

## 2023-01-26 DIAGNOSIS — E038 Other specified hypothyroidism: Secondary | ICD-10-CM

## 2023-01-26 NOTE — Assessment & Plan Note (Signed)
Sleep Apnea Compliance with CPAP, expresses some occasional discomfort with current device setup. -Recommend patient to contact CPAP provider to discuss alternative nasal pillow setup for increased comfort and potential improved compliance.

## 2023-01-26 NOTE — Assessment & Plan Note (Signed)
Normal TFTs on recent labs.

## 2023-01-26 NOTE — Assessment & Plan Note (Signed)
Renal Function Trend of increasing BUN and creatinine over the past two years, with a recent decrease in GFR. No known risk factors such as NSAID use or poor blood sugar control. -Order kidney ultrasound to assess structural integrity of kidneys. -Order urine microalbumin/creatinine ratio to assess for proteinuria. -Refer to Nephrology for further evaluation and management.

## 2023-01-26 NOTE — Assessment & Plan Note (Signed)
Hyperlipidemia Mildly elevated cholesterol levels. -Continue monitoring, no immediate intervention required.

## 2023-01-26 NOTE — Patient Instructions (Signed)
-   Obtain fasting labs with orders provided (can have water or black coffee but otherwise no food or drink x 8 hours before labs) - Review information provided - Attend eye doctor annually, dentist every 6 months, work towards or maintain 30 minutes of moderate intensity physical activity at least 5 days per week, and consume a balanced diet - Return in 1 year for physical - Contact us for any questions between now and then  Patient Summary:  - **Renal Function**: Upward trend in BUN and creatinine levels. Plan includes kidney ultrasound, urine test for protein, and referral to a nephrologist.  - **Sleep Apnea**: Difficulty using CPAP machine due to discomfort. Recommendation to contact CPAP provider for alternative setup.  - **Hyperlipidemia**: Slightly elevated cholesterol levels. Monitoring will continue; no immediate intervention required, work on healthy lifestyle.  - **General Health Maintenance**: Annual physical scheduled for next year.

## 2023-01-26 NOTE — Assessment & Plan Note (Signed)
Annual examination completed, risk stratification labs ordered, anticipatory guidance provided.  We will follow labs once resulted. 

## 2023-01-26 NOTE — Progress Notes (Signed)
Annual Physical Exam Visit  Patient Information:  Patient ID: Erin Tucker, female DOB: 1985/03/17 Age: 37 y.o. MRN: [id number]   Subjective:   CC: Annual Physical Exam  HPI:  Erin Tucker is here for their annual physical.  I reviewed the past medical history, family history, social history, surgical history, and allergies today and changes were made as necessary.  Please see the problem list section below for additional details.  Past Medical History: Past Medical History:  Diagnosis Date   Chronic back pain    Chronic neck pain    COVID-19 virus infection 12/2018   Fatigue    Food allergy    Herpes genitalis    High cholesterol    History of abnormal cervical Pap smear    Irregular menses    Knee pain    Migraines    Obesity (BMI 35.0-39.9 without comorbidity)    Other specified disorders of thyroid    Pre-diabetes    Swallowing difficulty    Thyroid nodule, cold    Urine incontinence    Vitamin D deficiency    Past Surgical History: Past Surgical History:  Procedure Laterality Date   CESAREAN SECTION  12/14/2013   THYROIDECTOMY, PARTIAL Right 07/03/2016   follicular adenoma   Family History: Family History  Problem Relation Age of Onset   Obesity Mother    Diabetes Father    Drug abuse Father    High blood pressure Father    Asthma Sister    Cancer Brother    Colon cancer Maternal Grandfather    Prostate cancer Maternal Grandfather    Cancer Maternal Grandfather        colon   Early death Paternal Grandfather    Breast cancer Neg Hx    Ovarian cancer Neg Hx    Heart disease Neg Hx    Allergies: Allergies  Allergen Reactions   Mangifera Indica Itching and Swelling   Other Itching    Red Hi-C drink   Health Maintenance: Health Maintenance  Topic Date Due   COVID-19 Vaccine (1 - 2024-25 season) 02/11/2023 (Originally 10/15/2022)   INFLUENZA VACCINE  05/14/2023 (Originally 09/14/2022)   Cervical Cancer Screening (HPV/Pap Cotest)   08/18/2027   DTaP/Tdap/Td (9 - Td or Tdap) 08/20/2029   Hepatitis C Screening  Completed   HIV Screening  Completed   HPV VACCINES  Aged Out    HM Colonoscopy   This patient has no relevant Health Maintenance data.    Medications: Current Outpatient Medications on File Prior to Visit  Medication Sig Dispense Refill   buPROPion (WELLBUTRIN XL) 300 MG 24 hr tablet Take 1 tablet (300 mg total) by mouth daily. 120 tablet 0   topiramate (TOPAMAX) 50 MG tablet Take 1 tablet (50 mg total) by mouth 2 (two) times daily. 240 tablet 0   No current facility-administered medications on file prior to visit.    Objective:   Vitals:   01/26/23 0758  BP: 112/64  Pulse: 75  SpO2: 99%   Vitals:   01/26/23 0758  Weight: 203 lb 3.2 oz (92.2 kg)  Height: 5\' 2"  (1.575 m)   Body mass index is 37.17 kg/m.  General: Well Developed, well nourished, and in no acute distress.  Neuro: Alert and oriented x3, extra-ocular muscles intact, sensation grossly intact. Cranial nerves II through XII are grossly intact, motor, sensory, and coordinative functions are intact. HEENT: Normocephalic, atraumatic, neck supple, no masses, no lymphadenopathy, thyroid nonenlarged. Oropharynx, nasopharynx, external ear canals  are unremarkable. Skin: Warm and dry, no rashes noted.  Cardiac: Regular rate and rhythm, no murmurs rubs or gallops. No peripheral edema. Pulses symmetric. Respiratory: Clear to auscultation bilaterally. Speaking in full sentences.  Abdominal: Soft, nontender, nondistended, positive bowel sounds, no masses, no organomegaly. Musculoskeletal: Stable, and with full range of motion.   Impression and Recommendations:   The patient was counselled, risk factors were discussed, and anticipatory guidance given.  Problem List Items Addressed This Visit       Respiratory   OSA (obstructive sleep apnea)   Sleep Apnea Compliance with CPAP, expresses some occasional discomfort with current device  setup. -Recommend patient to contact CPAP provider to discuss alternative nasal pillow setup for increased comfort and potential improved compliance.        Endocrine   Subclinical hypothyroidism   Normal TFTs on recent labs.        Genitourinary   CKD stage 3a, GFR 45-59 ml/min (HCC)   Renal Function Trend of increasing BUN and creatinine over the past two years, with a recent decrease in GFR. No known risk factors such as NSAID use or poor blood sugar control. -Order kidney ultrasound to assess structural integrity of kidneys. -Order urine microalbumin/creatinine ratio to assess for proteinuria. -Refer to Nephrology for further evaluation and management.        Other   Healthcare maintenance - Primary   Annual examination completed, risk stratification labs ordered, anticipatory guidance provided.  We will follow labs once resulted.      Other hyperlipidemia   Hyperlipidemia Mildly elevated cholesterol levels. -Continue monitoring, no immediate intervention required.        Orders & Medications Medications: No orders of the defined types were placed in this encounter.  No orders of the defined types were placed in this encounter.    Return in about 1 year (around 01/26/2024) for CPE.    Jerrol Banana, MD, [facility]   Primary Care Sports Medicine Primary Care and Sports Medicine at [facility]

## 2023-01-28 LAB — MICROALBUMIN / CREATININE URINE RATIO
Creatinine, Urine: 296.5 mg/dL
Microalb Creat Ratio: 2 mg/g{creat} (ref 0–29)
Microalb, Ur: 5.2 ug/mL — ABNORMAL HIGH

## 2023-01-29 ENCOUNTER — Ambulatory Visit
Admission: RE | Admit: 2023-01-29 | Discharge: 2023-01-29 | Disposition: A | Discharge status: Home / Self Care | Payer: Commercial Managed Care - PPO | Source: Ambulatory Visit | Attending: Family Medicine | Admitting: Family Medicine

## 2023-01-29 ENCOUNTER — Encounter: Payer: Self-pay | Admitting: Family Medicine

## 2023-01-29 DIAGNOSIS — N1831 Chronic kidney disease, stage 3a: Secondary | ICD-10-CM | POA: Diagnosis not present

## 2023-01-29 DIAGNOSIS — N1330 Unspecified hydronephrosis: Secondary | ICD-10-CM | POA: Diagnosis not present

## 2023-01-29 DIAGNOSIS — N189 Chronic kidney disease, unspecified: Secondary | ICD-10-CM | POA: Diagnosis not present

## 2023-01-29 NOTE — Telephone Encounter (Signed)
Please review.  KP

## 2023-02-05 DIAGNOSIS — R944 Abnormal results of kidney function studies: Secondary | ICD-10-CM | POA: Diagnosis not present

## 2023-02-07 DIAGNOSIS — G4733 Obstructive sleep apnea (adult) (pediatric): Secondary | ICD-10-CM | POA: Diagnosis not present

## 2023-03-06 DIAGNOSIS — R944 Abnormal results of kidney function studies: Secondary | ICD-10-CM | POA: Diagnosis not present

## 2023-03-08 ENCOUNTER — Other Ambulatory Visit: Payer: Self-pay | Admitting: Nephrology

## 2023-03-08 DIAGNOSIS — N289 Disorder of kidney and ureter, unspecified: Secondary | ICD-10-CM

## 2023-03-15 ENCOUNTER — Ambulatory Visit
Admission: RE | Admit: 2023-03-15 | Discharge: 2023-03-15 | Disposition: A | Discharge status: Home / Self Care | Payer: Commercial Managed Care - PPO | Source: Ambulatory Visit | Attending: Nephrology | Admitting: Nephrology

## 2023-03-15 DIAGNOSIS — N3289 Other specified disorders of bladder: Secondary | ICD-10-CM | POA: Diagnosis not present

## 2023-03-15 DIAGNOSIS — N289 Disorder of kidney and ureter, unspecified: Secondary | ICD-10-CM | POA: Diagnosis not present

## 2023-03-15 DIAGNOSIS — R944 Abnormal results of kidney function studies: Secondary | ICD-10-CM | POA: Diagnosis not present

## 2023-07-01 ENCOUNTER — Telehealth: Admitting: Physician Assistant

## 2023-07-01 ENCOUNTER — Other Ambulatory Visit: Payer: Self-pay

## 2023-07-01 DIAGNOSIS — N760 Acute vaginitis: Secondary | ICD-10-CM

## 2023-07-01 MED ORDER — FLUCONAZOLE 150 MG PO TABS
150.0000 mg | ORAL_TABLET | Freq: Once | ORAL | 0 refills | Status: AC
  Filled 2023-07-01: qty 1, 1d supply, fill #0

## 2023-07-01 NOTE — Progress Notes (Signed)

## 2023-07-01 NOTE — Progress Notes (Signed)
 I have spent 5 minutes in review of e-visit questionnaire, review and updating patient chart, medical decision making and response to patient.   Laure Kidney, PA-C

## 2023-08-09 ENCOUNTER — Other Ambulatory Visit: Payer: Self-pay

## 2023-08-09 ENCOUNTER — Telehealth: Admitting: Physician Assistant

## 2023-08-09 DIAGNOSIS — B3731 Acute candidiasis of vulva and vagina: Secondary | ICD-10-CM

## 2023-08-09 MED ORDER — FLUCONAZOLE 150 MG PO TABS
ORAL_TABLET | ORAL | 0 refills | Status: DC
  Filled 2023-08-09: qty 2, 4d supply, fill #0

## 2023-08-09 NOTE — Progress Notes (Signed)

## 2023-08-09 NOTE — Progress Notes (Signed)
 I have spent 5 minutes in review of e-visit questionnaire, review and updating patient chart, medical decision making and response to patient.   Piedad Climes, PA-C

## 2023-08-30 ENCOUNTER — Other Ambulatory Visit (HOSPITAL_COMMUNITY)
Admission: RE | Admit: 2023-08-30 | Discharge: 2023-08-30 | Disposition: A | Discharge status: Home / Self Care | Source: Ambulatory Visit | Attending: Obstetrics & Gynecology | Admitting: Obstetrics & Gynecology

## 2023-08-30 ENCOUNTER — Encounter: Payer: Self-pay | Admitting: Obstetrics & Gynecology

## 2023-08-30 ENCOUNTER — Ambulatory Visit (INDEPENDENT_AMBULATORY_CARE_PROVIDER_SITE_OTHER): Admitting: Obstetrics & Gynecology

## 2023-08-30 VITALS — BP 108/72 | HR 66 | Wt 221.0 lb

## 2023-08-30 DIAGNOSIS — N898 Other specified noninflammatory disorders of vagina: Secondary | ICD-10-CM | POA: Insufficient documentation

## 2023-08-30 DIAGNOSIS — Z113 Encounter for screening for infections with a predominantly sexual mode of transmission: Secondary | ICD-10-CM | POA: Diagnosis not present

## 2023-08-30 DIAGNOSIS — Z01419 Encounter for gynecological examination (general) (routine) without abnormal findings: Secondary | ICD-10-CM | POA: Diagnosis not present

## 2023-08-30 DIAGNOSIS — Z8742 Personal history of other diseases of the female genital tract: Secondary | ICD-10-CM

## 2023-08-30 NOTE — Progress Notes (Signed)
 GYNECOLOGY ANNUAL PREVENTATIVE CARE ENCOUNTER NOTE  History:    Erin Tucker is a 38 y.o. [id number] female here for a routine annual gynecologic exam.  Current complaints: had yeast infections back to back recently, wants to make sure she does not have another episode of vagintis. No vaginal discharge or irritation reported.  Also desires STI screen.   Denies abnormal vaginal bleeding, discharge, pelvic pain, problems with intercourse or other gynecologic concerns.  Gynecologic History Patient's last menstrual period was 08/25/2023 (exact date). Contraception: none Last Pap: 08/18/2022. Result was normal with negative HPV  Obstetric History OB History  Gravida Para Term Preterm AB Living  1 1 1   1   SAB IAB Ectopic Multiple Live Births      1    # Outcome Date GA Lbr Len/2nd Weight Sex Type Anes PTL Lv  1 Term 12/14/13 [redacted]w[redacted]d  7 lb 6 oz (3.345 kg) M CS-LTranv   LIV     Complications: Intraamniotic Infection, Failure to Progress in First Stage, Fetal Intolerance    Past Medical History:  Diagnosis Date   Chronic back pain    Chronic neck pain    COVID-19 virus infection 12/2018   Fatigue    Food allergy    Herpes genitalis    High cholesterol    History of abnormal cervical Pap smear    Irregular menses    Knee pain    Migraines    Obesity (BMI 35.0-39.9 without comorbidity)    Other specified disorders of thyroid     Pre-diabetes    Swallowing difficulty    Thyroid  nodule, cold    Urine incontinence    Vitamin D  deficiency     Past Surgical History:  Procedure Laterality Date   CESAREAN SECTION  12/14/2013   THYROIDECTOMY, PARTIAL Right 07/03/2016   follicular adenoma    Current Outpatient Medications on File Prior to Visit  Medication Sig Dispense Refill   fluconazole  (DIFLUCAN ) 150 MG tablet Take 1 tablet by mouth once. Repeat in 3 days if needed. (Patient not taking: Reported on 08/30/2023) 2 tablet 0   No current facility-administered medications on file  prior to visit.    Allergies  Allergen Reactions   Mangifera Indica Itching and Swelling   Other Itching    Red Hi-C drink    Social History:  reports that she has never smoked. She has never used smokeless tobacco. She reports current alcohol use. She reports that she does not use drugs.  Family History  Problem Relation Age of Onset   Obesity Mother    Diabetes Father    Drug abuse Father    High blood pressure Father    Asthma Sister    Cancer Brother    Colon cancer Maternal Grandfather    Prostate cancer Maternal Grandfather    Cancer Maternal Grandfather        colon   Early death Paternal Grandfather    Breast cancer Neg Hx    Ovarian cancer Neg Hx    Heart disease Neg Hx     The following portions of the patient's history were reviewed and updated as appropriate: allergies, current medications, past family history, past medical history, past social history, past surgical history and problem list.  Review of Systems Pertinent items noted in HPI and remainder of comprehensive ROS otherwise negative.  Physical Exam:  BP 108/72   Pulse 66   Wt 221 lb (100.2 kg)   LMP 08/25/2023 (Exact Date)   BMI  40.42 kg/m  CONSTITUTIONAL: Well-developed, well-nourished female in no acute distress.  HENT:  Normocephalic, atraumatic, External right and left ear normal.  EYES: Conjunctivae and EOM are normal. Pupils are equal, round, and reactive to light. No scleral icterus.  NECK: Normal range of motion, supple, no masses observed. SKIN: Skin is warm and dry. No rash noted. Not diaphoretic. No erythema. No pallor. MUSCULOSKELETAL: Normal range of motion. No tenderness.  No cyanosis, clubbing, or edema. NEUROLOGIC: Alert and oriented to person, place, and time. Normal muscle tone coordination.  PSYCHIATRIC: Normal mood and affect. Normal behavior. Normal judgment and thought content. CARDIOVASCULAR: Normal heart rate noted, regular rhythm RESPIRATORY: Clear to auscultation  bilaterally. Effort and breath sounds normal, no problems with respiration noted. BREASTS: Symmetric in size. No masses, tenderness, skin changes, nipple drainage, or lymphadenopathy bilaterally. Performed in the presence of a chaperone. ABDOMEN: Soft, no distention noted.  No tenderness, rebound or guarding.  PELVIC: Normal appearing external genitalia and urethral meatus,  no abnormal vaginal discharge noted, testing sample obtained.  Normal uterine size, no other palpable masses, no uterine or adnexal tenderness.  Performed in the presence of a chaperone.  Assessment and Plan:    1. History of vaginitis  Proper vulvar hygiene emphasized: discussed avoidance of perfumed soaps, detergents, lotions and any type of douches; in addition to wearing cotton underwear and no underwear at night.  Also recommended cleaning front to back, voiding and cleaning up after intercourse.  - Cervicovaginal ancillary only( [place]) done, will follow up results and manage accordingly.  2. Routine screening for STI (sexually transmitted infection) STI screen requested, will follow up results and manage accordingly. - Cervicovaginal ancillary only( [place]) - RPR+HBsAg+HCVAb+HIV  3. Encounter for gynecological examination without abnormal finding (Primary) Pap is up to date. Normal breast examination today, she was advised to perform periodic self breast examinations.  Routine preventative health maintenance measures emphasized. Please refer to After Visit Summary for other counseling recommendations.      GLORIS HUGGER, MD, FACOG Obstetrician & Gynecologist, [facility] [employer], [facility]

## 2023-08-31 ENCOUNTER — Ambulatory Visit: Payer: Self-pay | Admitting: Obstetrics & Gynecology

## 2023-08-31 DIAGNOSIS — B3731 Acute candidiasis of vulva and vagina: Secondary | ICD-10-CM

## 2023-08-31 LAB — RPR+HBSAG+HCVAB+...
HIV Screen 4th Generation wRfx: NONREACTIVE
Hep C Virus Ab: NONREACTIVE
Hepatitis B Surface Ag: NEGATIVE
RPR Ser Ql: NONREACTIVE

## 2023-09-03 LAB — CERVICOVAGINAL ANCILLARY ONLY
Bacterial Vaginitis (gardnerella): NEGATIVE
Candida Glabrata: NEGATIVE
Candida Vaginitis: POSITIVE — AB
Chlamydia: NEGATIVE
Comment: NEGATIVE
Comment: NEGATIVE
Comment: NEGATIVE
Comment: NEGATIVE
Comment: NEGATIVE
Comment: NORMAL
Neisseria Gonorrhea: NEGATIVE
Trichomonas: NEGATIVE

## 2023-09-04 DIAGNOSIS — R944 Abnormal results of kidney function studies: Secondary | ICD-10-CM | POA: Diagnosis not present

## 2023-09-05 ENCOUNTER — Other Ambulatory Visit: Payer: Self-pay

## 2023-09-05 MED ORDER — FLUCONAZOLE 150 MG PO TABS
ORAL_TABLET | ORAL | 0 refills | Status: DC
  Filled 2023-09-05: qty 2, 3d supply, fill #0

## 2023-09-10 DIAGNOSIS — Z713 Dietary counseling and surveillance: Secondary | ICD-10-CM | POA: Diagnosis not present

## 2023-09-19 DIAGNOSIS — Z713 Dietary counseling and surveillance: Secondary | ICD-10-CM | POA: Diagnosis not present

## 2023-10-10 DIAGNOSIS — Z713 Dietary counseling and surveillance: Secondary | ICD-10-CM | POA: Diagnosis not present

## 2023-10-25 DIAGNOSIS — Z713 Dietary counseling and surveillance: Secondary | ICD-10-CM | POA: Diagnosis not present

## 2023-10-26 ENCOUNTER — Ambulatory Visit (INDEPENDENT_AMBULATORY_CARE_PROVIDER_SITE_OTHER): Admitting: Family Medicine

## 2023-10-26 ENCOUNTER — Other Ambulatory Visit (INDEPENDENT_AMBULATORY_CARE_PROVIDER_SITE_OTHER): Payer: Self-pay

## 2023-10-26 VITALS — BP 129/83 | HR 83 | Wt 224.0 lb

## 2023-10-26 DIAGNOSIS — Z3A01 Less than 8 weeks gestation of pregnancy: Secondary | ICD-10-CM

## 2023-10-26 DIAGNOSIS — O021 Missed abortion: Secondary | ICD-10-CM | POA: Diagnosis not present

## 2023-10-26 DIAGNOSIS — O3680X0 Pregnancy with inconclusive fetal viability, not applicable or unspecified: Secondary | ICD-10-CM | POA: Diagnosis not present

## 2023-10-26 DIAGNOSIS — O3680X1 Pregnancy with inconclusive fetal viability, fetus 1: Secondary | ICD-10-CM | POA: Diagnosis not present

## 2023-10-26 NOTE — Progress Notes (Signed)

## 2023-10-26 NOTE — Progress Notes (Signed)
   MISCARRIAGE/DC follow up  Subjective:   Erin Tucker is a 38 y.o. [phone] female here for new OB intake with US  showing failed pregnancy. Denies continued bleeding/ cramping. Reports emotionally OK   Previous US  findings reviewed and significant for empty gestational sac  The following portions of the patient's history were reviewed and updated as appropriate: allergies, current medications, past family history, past medical history, past social history, past surgical history and problem list.  Review of Systems Pertinent items are noted in HPI.   Objective:  BP 129/83   Pulse 83   Wt 224 lb (101.6 kg)   LMP 08/25/2023 (Exact Date)   BMI 40.97 kg/m  Gen: well appearing, NAD HEENT: no scleral icterus CV: RR Lung: Normal WOB Ext: warm well perfused   Assessment and Plan:  1. Miscarriage within last 12 months - Missed AB or incomplete AB? Yes--offer expectant management, cytotec or DC.  - Counseled about how it might take 1 week to get in for procedure and that cytotec is reasonable to try to avoid procedure.  - Patient choice expectant management.  -Reviewed having US  in 2 week to see if pregnancy has passed and option to call office for cytotec at any time if desired. Reviewed expectant management can take 2-4 weeks.     Face to face time:  30 minutes  Greater than 50% of the visit time was spent in counseling and coordination of care with the patient.  The summary and outline of the counseling and care coordination is summarized in the note above.   All questions were answered.  Please refer to After Visit Summary for other counseling recommendations.   Return in about 2 weeks (around 11/09/2023) for f.u missed AB and US  . Future Appointments  Date Time Provider Department Center  11/09/2023 10:15 AM Fredirick Glenys RAMAN, MD [facility]  01/28/2024  8:20 AM Alvia Selinda PARAS, MD [facility] [phone] [place]

## 2023-10-27 LAB — ABO/RH: Rh Factor: NEGATIVE

## 2023-10-27 LAB — CBC
Hematocrit: 39.7 % (ref 34.0–46.6)
Hemoglobin: 12.6 g/dL (ref 11.1–15.9)
MCH: 28.8 pg (ref 26.6–33.0)
MCHC: 31.7 g/dL (ref 31.5–35.7)
MCV: 91 fL (ref 79–97)
Platelets: 351 x10E3/uL (ref 150–450)
RBC: 4.38 x10E6/uL (ref 3.77–5.28)
RDW: 13.3 % (ref 11.7–15.4)
WBC: 7.4 x10E3/uL (ref 3.4–10.8)

## 2023-10-29 ENCOUNTER — Ambulatory Visit: Payer: Self-pay | Admitting: Family Medicine

## 2023-10-29 DIAGNOSIS — Z6791 Unspecified blood type, Rh negative: Secondary | ICD-10-CM | POA: Insufficient documentation

## 2023-10-29 DIAGNOSIS — O26899 Other specified pregnancy related conditions, unspecified trimester: Secondary | ICD-10-CM | POA: Insufficient documentation

## 2023-10-29 DIAGNOSIS — O26891 Other specified pregnancy related conditions, first trimester: Secondary | ICD-10-CM

## 2023-11-08 ENCOUNTER — Encounter: Admitting: Obstetrics & Gynecology

## 2023-11-09 ENCOUNTER — Ambulatory Visit (INDEPENDENT_AMBULATORY_CARE_PROVIDER_SITE_OTHER): Admitting: Family Medicine

## 2023-11-09 VITALS — BP 129/83 | HR 80

## 2023-11-09 DIAGNOSIS — O039 Complete or unspecified spontaneous abortion without complication: Secondary | ICD-10-CM | POA: Diagnosis not present

## 2023-11-09 NOTE — Progress Notes (Addendum)
   Subjective:    Patient ID: Erin Tucker is a 38 y.o. female presenting with Follow-up  on 11/09/2023  HPI: Here with missed AB. Started bleeding on Monday. Passed tissue on Wednesday. She noted bleeding as not increased. She is grieving.  Review of Systems  Constitutional:  Negative for chills and fever.  Respiratory:  Negative for shortness of breath.   Cardiovascular:  Negative for chest pain.  Gastrointestinal:  Negative for abdominal pain, nausea and vomiting.  Genitourinary:  Negative for dysuria.  Skin:  Negative for rash.      Objective:    BP 129/83   Pulse 80   LMP 08/25/2023 (Exact Date)  Physical Exam Exam conducted with a chaperone present.  Constitutional:      General: She is not in acute distress.    Appearance: She is well-developed.  HENT:     Head: Normocephalic and atraumatic.  Eyes:     General: No scleral icterus. Cardiovascular:     Rate and Rhythm: Normal rate.  Pulmonary:     Effort: Pulmonary effort is normal.  Abdominal:     Palpations: Abdomen is soft.  Musculoskeletal:     Cervical back: Neck supple.  Skin:    General: Skin is warm and dry.  Neurological:     Mental Status: She is alert and oriented to person, place, and time.         Assessment & Plan:  1. SAB (spontaneous abortion) (Primary) Discussed commonness at early gestational age. E. RH negative, should not need Rhogam. Discussed impossible to prevent, predict and likelihood of repeat. Normal grieving process.    Return if symptoms worsen or fail to improve.  Glenys GORMAN Birk, MD 11/09/2023 12:05 PM

## 2023-11-09 NOTE — Progress Notes (Signed)
 Pt started bleeding on Monday, feels certain she passed the pregnancy on Wed, bleeding has gotten better since then and having some light cramping now.

## 2023-11-14 DIAGNOSIS — O039 Complete or unspecified spontaneous abortion without complication: Secondary | ICD-10-CM

## 2023-11-14 HISTORY — DX: Complete or unspecified spontaneous abortion without complication: O03.9

## 2023-12-04 ENCOUNTER — Encounter: Payer: Self-pay | Admitting: Family Medicine

## 2023-12-05 ENCOUNTER — Other Ambulatory Visit: Payer: Self-pay

## 2023-12-05 ENCOUNTER — Other Ambulatory Visit (HOSPITAL_COMMUNITY)
Admission: RE | Admit: 2023-12-05 | Discharge: 2023-12-05 | Disposition: A | Source: Ambulatory Visit | Attending: Obstetrics & Gynecology | Admitting: Obstetrics & Gynecology

## 2023-12-05 ENCOUNTER — Ambulatory Visit: Admitting: Obstetrics & Gynecology

## 2023-12-05 VITALS — BP 119/76 | HR 76 | Wt 217.0 lb

## 2023-12-05 DIAGNOSIS — O0339 Incomplete spontaneous abortion with other complications: Secondary | ICD-10-CM | POA: Diagnosis not present

## 2023-12-05 DIAGNOSIS — O039 Complete or unspecified spontaneous abortion without complication: Secondary | ICD-10-CM | POA: Insufficient documentation

## 2023-12-05 DIAGNOSIS — N898 Other specified noninflammatory disorders of vagina: Secondary | ICD-10-CM | POA: Insufficient documentation

## 2023-12-05 DIAGNOSIS — O034 Incomplete spontaneous abortion without complication: Secondary | ICD-10-CM

## 2023-12-05 DIAGNOSIS — Z3A01 Less than 8 weeks gestation of pregnancy: Secondary | ICD-10-CM | POA: Diagnosis not present

## 2023-12-05 DIAGNOSIS — N939 Abnormal uterine and vaginal bleeding, unspecified: Secondary | ICD-10-CM | POA: Insufficient documentation

## 2023-12-05 DIAGNOSIS — B9689 Other specified bacterial agents as the cause of diseases classified elsewhere: Secondary | ICD-10-CM

## 2023-12-05 MED ORDER — METRONIDAZOLE 500 MG PO TABS
500.0000 mg | ORAL_TABLET | Freq: Two times a day (BID) | ORAL | 0 refills | Status: DC
  Filled 2023-12-05: qty 14, 7d supply, fill #0

## 2023-12-05 NOTE — Progress Notes (Unsigned)
 Pt still having bleeding   Patient informed that the ultrasound is considered a limited obstetric ultrasound and is not intended to be a complete ultrasound exam.  Patient also informed that the ultrasound is not being completed with the intent of assessing for fetal or placental anomalies or any pelvic abnormalities. Explained that the purpose of today's ultrasound is to assess for vaginal bleeding.  Patient acknowledges the purpose of the exam and the limitations of the study.        Wanda Buckles, RN

## 2023-12-06 ENCOUNTER — Ambulatory Visit: Payer: Self-pay | Admitting: Obstetrics & Gynecology

## 2023-12-06 ENCOUNTER — Other Ambulatory Visit (HOSPITAL_COMMUNITY): Payer: Self-pay | Admitting: Obstetrics & Gynecology

## 2023-12-06 ENCOUNTER — Encounter (HOSPITAL_COMMUNITY): Payer: Self-pay | Admitting: Obstetrics & Gynecology

## 2023-12-06 ENCOUNTER — Other Ambulatory Visit: Payer: Self-pay

## 2023-12-06 DIAGNOSIS — B3731 Acute candidiasis of vulva and vagina: Secondary | ICD-10-CM

## 2023-12-06 DIAGNOSIS — O039 Complete or unspecified spontaneous abortion without complication: Secondary | ICD-10-CM

## 2023-12-06 LAB — BETA HCG QUANT (REF LAB): hCG Quant: 4 m[IU]/mL

## 2023-12-06 LAB — CERVICOVAGINAL ANCILLARY ONLY
Bacterial Vaginitis (gardnerella): POSITIVE — AB
Candida Glabrata: NEGATIVE
Candida Vaginitis: POSITIVE — AB
Chlamydia: NEGATIVE
Comment: NEGATIVE
Comment: NEGATIVE
Comment: NEGATIVE
Comment: NEGATIVE
Comment: NEGATIVE
Comment: NORMAL
Neisseria Gonorrhea: NEGATIVE
Trichomonas: NEGATIVE

## 2023-12-06 LAB — CBC
Hematocrit: 32.5 % — ABNORMAL LOW (ref 34.0–46.6)
Hemoglobin: 10.4 g/dL — ABNORMAL LOW (ref 11.1–15.9)
MCH: 28.1 pg (ref 26.6–33.0)
MCHC: 32 g/dL (ref 31.5–35.7)
MCV: 88 fL (ref 79–97)
Platelets: 428 x10E3/uL (ref 150–450)
RBC: 3.7 x10E6/uL — ABNORMAL LOW (ref 3.77–5.28)
RDW: 12.9 % (ref 11.7–15.4)
WBC: 6.5 x10E3/uL (ref 3.4–10.8)

## 2023-12-06 MED ORDER — FLUCONAZOLE 150 MG PO TABS
150.0000 mg | ORAL_TABLET | Freq: Once | ORAL | 3 refills | Status: DC
  Filled 2023-12-06: qty 1, 1d supply, fill #0

## 2023-12-06 NOTE — Anesthesia Preprocedure Evaluation (Signed)
 Anesthesia Evaluation  Patient identified by MRN, date of birth, ID band Patient awake    Reviewed: Allergy & Precautions, NPO status , Patient's Chart, lab work & pertinent test results  Airway Mallampati: III  TM Distance: >3 FB Neck ROM: Full    Dental  (+) Teeth Intact, Dental Advisory Given   Pulmonary sleep apnea and Continuous Positive Airway Pressure Ventilation    Pulmonary exam normal breath sounds clear to auscultation       Cardiovascular negative cardio ROS Normal cardiovascular exam Rhythm:Regular Rate:Normal     Neuro/Psych negative neurological ROS  negative psych ROS   GI/Hepatic negative GI ROS, Neg liver ROS,,,  Endo/Other  diabetes (prediabetic)Hypothyroidism  Class 3 obesity (BMI 40)  Renal/GU negative Renal ROS  negative genitourinary   Musculoskeletal negative musculoskeletal ROS (+)    Abdominal  (+) + obese  Peds  Hematology negative hematology ROS (+) Hb 10.4, plt 428   Anesthesia Other Findings   Reproductive/Obstetrics SAB                              Anesthesia Physical Anesthesia Plan  ASA: 3  Anesthesia Plan: General   Post-op Pain Management: Tylenol  PO (pre-op)* and Toradol IV (intra-op)*   Induction: Intravenous  PONV Risk Score and Plan: 4 or greater and Ondansetron, Dexamethasone, Midazolam and Treatment may vary due to age or medical condition  Airway Management Planned: LMA  Additional Equipment: None  Intra-op Plan:   Post-operative Plan: Extubation in OR  Informed Consent: I have reviewed the patients History and Physical, chart, labs and discussed the procedure including the risks, benefits and alternatives for the proposed anesthesia with the patient or authorized representative who has indicated his/her understanding and acceptance.     Dental advisory given  Plan Discussed with: CRNA  Anesthesia Plan Comments:           Anesthesia Quick Evaluation

## 2023-12-06 NOTE — Progress Notes (Signed)
 GYNECOLOGY OFFICE VISIT NOTE  History:  Erin Tucker is a 38 y.o. G2P1001 here today for evaluation of continued bleeding since recent SAB diagnosed on 10/26/23.  Bleeding is heavy some times, lighter other days but still persistent.  She is concerned that she did not pass all the tissue.  No fevers or abdominal pain, but she reports a foul vaginal odor which makes her worry she has BV. She denies any other concerns.  Past Medical History:  Diagnosis Date   Abnormal renal function test    nephrologist--   Dr CHRISTELLA. Marcelino (lov 09-04-2023  pt released PRN);   after extensive work-up negative for  no underlying CKD   Chronic back pain    Chronic neck pain    Fatigue    Herpes genitalis    History of abnormal cervical Pap smear    Hyperlipidemia    Iron deficiency anemia    Irregular menses    Miscarriage 11/2023   Obesity (BMI 35.0-39.9 without comorbidity)    OSA on CPAP 12/2021   (12-06-2023  pt stated uses nightly)   followed by pcp;    HSS 01-07-2022  in epic,  AHI 8.9/ hr,  ,mild   Pre-diabetes    Subclinical hypothyroidism    followed by pcp;   s/p  right throid lobectomy for right thyroid  nodule,  follicular adenoma   Vitamin D  deficiency    Wears contact lenses     Past Surgical History:  Procedure Laterality Date   CESAREAN SECTION  12/14/2013   @[facility]    THYROIDECTOMY, PARTIAL Right 07/03/2016   @[facility]  by Dr FREDRIK Cramp;   follicular adenoma   WISDOM TOOTH EXTRACTION      The following portions of the patient's history were reviewed and updated as appropriate: allergies, current medications, past family history, past medical history, past social history, past surgical history and problem list.   Health Maintenance:  Normal pap and negative HRHPV on 08/18/22.   Review of Systems:  Pertinent items noted in HPI and remainder of comprehensive ROS otherwise negative.  Physical Exam:  BP 119/76   Pulse 76   Wt 217 lb (98.4 kg)   LMP 08/25/2023 (Exact Date)   BMI 39.69  kg/m  CONSTITUTIONAL: Well-developed, well-nourished female in no acute distress.  MUSCULOSKELETAL: Normal range of motion. No edema noted. NEUROLOGIC: Alert and oriented to person, place, and time. Normal muscle tone coordination. No cranial nerve deficit noted on observation. PSYCHIATRIC: Normal mood and affect. Normal behavior. Normal judgment and thought content. CARDIOVASCULAR: Normal heart rate noted RESPIRATORY: Effort and breath sounds normal, no problems with respiration noted ABDOMEN: No masses or other overt distention noted on observation. No tenderness.   PELVIC: Deferred, patient obtained self-swab  Labs and Imaging US  OB Transvaginal Result Date: 12/05/2023 ----------------------------------------------------------------------  OBSTETRICS REPORT                       (Signed Final 12/05/2023 02:26 pm) ---------------------------------------------------------------------- Patient Info  ID #:       [id number]                          D.O.B.:  [birth year] (37 yrs)(F)  Name:       Erin Tucker                 Visit Date: 12/05/2023 01:52 pm ---------------------------------------------------------------------- Performed By  Attending:        Gloris Hugger  Ref. Address:     [address]                    [place]                                                             [address]  Performed By:     Wanda Buckles RN     [facility]         [facility]                                                             [facility]  Referred By:      [facility] Correne Beagle ---------------------------------------------------------------------- Orders  #  Description                           Code        Ordered By  1  US  OB TRANSVAGINAL                    23182.9     GLORIS HUGGER ----------------------------------------------------------------------  #  Order #                      Accession #                Episode #  1  [id number]                   [id number]                 [id number] ---------------------------------------------------------------------- Indications  SAB                                            Z3A.00 ---------------------------------------------------------------------- Fetal Evaluation  Num Of Fetuses:         1  Cardiac Activity:       No embryo visualized ---------------------------------------------------------------------- OB History  Gravidity:    2         Term:   1        Prem:   0        SAB:   0  TOP:  0       Ectopic:  0        Living: 1 ---------------------------------------------------------------------- Impression  Significant products still visible within endometrial cavity, this  could be cause of her prolonged bleeding. ---------------------------------------------------------------------- Recommendations  Patient counseled about need for Dilation and Evacuation  procedure, this will be scheduled soon. ----------------------------------------------------------------------               Gloris Hugger, MD Electronically Signed Final Report   12/05/2023 02:26 pm ----------------------------------------------------------------------    Assessment and Plan:     1. Vaginal odor 2. BV (bacterial vaginosis) Treated for presumed BV, will follow up results and manage accordingly. - Cervicovaginal ancillary only( [place]) - metroNIDAZOLE  (FLAGYL ) 500 MG tablet; Take 1 tablet (500 mg total) by mouth 2 (two) times daily for 7 days.  Dispense: 14 tablet; Refill: 0  3. Vaginal bleeding 4. Retained products of conception after miscarriage (Primary) - US  OB Transvaginal; Future - Cervicovaginal ancillary only( Kelly) - CBC - Beta hCG quant (ref lab) - Ambulatory Referral For Surgery Scheduling Patient has retained products of conception after miscarriage.  Will check labs today,will follow up results and manage  accordingly. Counseled about needing Dilation and Evacuation, will do this under ultrasound guidance. She agrees with this plan.  Risks of surgery including bleeding, infection, injury to surrounding organs, need for additional procedures, possibility of intrauterine scarring which may impair future fertility, risk of retained products which may require further management and other postoperative/anesthesia complications were explained to patient.   Patient was told that the likelihood that her condition and symptoms will be treated effectively with this surgical management was very high; the postoperative expectations were also discussed in detail. The patient also understands the alternative treatment options which were discussed in full. All questions were answered.  She was told that she will be contacted by our surgical scheduler regarding the time and date of her surgery; routine preoperative instructions will be given to her by the preoperative nursing team.  Printed patient education handouts about the procedure were given to the patient to review at home.   Routine preventative health maintenance measures emphasized. Please refer to After Visit Summary for other counseling recommendations.   Return in about 2 weeks (around 12/19/2023) for Postop visit with Shanea Karney.    I spent 45 minutes dedicated to the care of this patient including pre-visit review of records, face to face time with the patient discussing her conditions and treatments, post visit ordering of medications and appropriate tests or procedures, coordinating care and documenting this visit encounter.    GLORIS HUGGER, MD, FACOG Obstetrician & Gynecologist, [place] for [employer], [facility]

## 2023-12-06 NOTE — Progress Notes (Signed)
 Spoke w/ via phone for pre-op interview--- pt Lab needs dos----  no (per anes)       Lab results------ current lab results in epic CBC dated 12-05-2023/  ABO/Rh 10-26-2023  COVID test -----patient states asymptomatic no test needed Arrive at -------  1215 on 12-07-2023 NPO after MN NO Solid Food.  Clear liquids from MN until--- 1015 Pre-Surgery Ensure or G2: n/a  Med rec completed Medications to take morning of surgery ----- none Diabetic medication ----- n/a  GLP1 agonist last dose: n/a GLP1 instructions:  Patient instructed no nail polish to be worn day of surgery Patient instructed to bring photo id and insurance card day of surgery Patient aware to have Driver (ride ) / caregiver    for 24 hours after surgery - boyfriend, kenneth flowers  Patient Special Instructions ----- n/a Pre-Op special Instructions ----- case just added on today,  orders pending  Patient verbalized understanding of instructions that were given at this phone interview. Patient denies chest pain, sob, fever, cough at the interview.

## 2023-12-06 NOTE — H&P (View-Only) (Signed)
 GYNECOLOGY OFFICE VISIT NOTE  History:  Erin Tucker is a 38 y.o. G2P1001 here today for evaluation of continued bleeding since recent SAB diagnosed on 10/26/23.  Bleeding is heavy some times, lighter other days but still persistent.  She is concerned that she did not pass all the tissue.  No fevers or abdominal pain, but she reports a foul vaginal odor which makes her worry she has BV. She denies any other concerns.  Past Medical History:  Diagnosis Date   Abnormal renal function test    nephrologist--   Dr CHRISTELLA. Marcelino (lov 09-04-2023  pt released PRN);   after extensive work-up negative for  no underlying CKD   Chronic back pain    Chronic neck pain    Fatigue    Herpes genitalis    History of abnormal cervical Pap smear    Hyperlipidemia    Iron deficiency anemia    Irregular menses    Miscarriage 11/2023   Obesity (BMI 35.0-39.9 without comorbidity)    OSA on CPAP 12/2021   (12-06-2023  pt stated uses nightly)   followed by pcp;    HSS 01-07-2022  in epic,  AHI 8.9/ hr,  ,mild   Pre-diabetes    Subclinical hypothyroidism    followed by pcp;   s/p  right throid lobectomy for right thyroid  nodule,  follicular adenoma   Vitamin D  deficiency    Wears contact lenses     Past Surgical History:  Procedure Laterality Date   CESAREAN SECTION  12/14/2013   @[facility]    THYROIDECTOMY, PARTIAL Right 07/03/2016   @[facility]  by Dr FREDRIK Cramp;   follicular adenoma   WISDOM TOOTH EXTRACTION      The following portions of the patient's history were reviewed and updated as appropriate: allergies, current medications, past family history, past medical history, past social history, past surgical history and problem list.   Health Maintenance:  Normal pap and negative HRHPV on 08/18/22.   Review of Systems:  Pertinent items noted in HPI and remainder of comprehensive ROS otherwise negative.  Physical Exam:  BP 119/76   Pulse 76   Wt 217 lb (98.4 kg)   LMP 08/25/2023 (Exact Date)   BMI 39.69  kg/m  CONSTITUTIONAL: Well-developed, well-nourished female in no acute distress.  MUSCULOSKELETAL: Normal range of motion. No edema noted. NEUROLOGIC: Alert and oriented to person, place, and time. Normal muscle tone coordination. No cranial nerve deficit noted on observation. PSYCHIATRIC: Normal mood and affect. Normal behavior. Normal judgment and thought content. CARDIOVASCULAR: Normal heart rate noted RESPIRATORY: Effort and breath sounds normal, no problems with respiration noted ABDOMEN: No masses or other overt distention noted on observation. No tenderness.   PELVIC: Deferred, patient obtained self-swab  Labs and Imaging US  OB Transvaginal Result Date: 12/05/2023 ----------------------------------------------------------------------  OBSTETRICS REPORT                       (Signed Final 12/05/2023 02:26 pm) ---------------------------------------------------------------------- Patient Info  ID #:       [id number]                          D.O.B.:  [birth year] (37 yrs)(F)  Name:       Erin Tucker                 Visit Date: 12/05/2023 01:52 pm ---------------------------------------------------------------------- Performed By  Attending:        Gloris Hugger  Ref. Address:     [address]                    [place]                                                             [address]  Performed By:     Wanda Buckles RN     Location:         [facility]                                                             [facility]  Referred By:      [facility] Correne Beagle ---------------------------------------------------------------------- Orders  #  Description                           Code        Ordered By  1  US  OB TRANSVAGINAL                    23182.9     GLORIS HUGGER ----------------------------------------------------------------------  #  Order #                      Accession #                Episode #  1  [id number]                   [id number]                 [id number] ---------------------------------------------------------------------- Indications  SAB                                            Z3A.00 ---------------------------------------------------------------------- Fetal Evaluation  Num Of Fetuses:         1  Cardiac Activity:       No embryo visualized ---------------------------------------------------------------------- OB History  Gravidity:    2         Term:   1        Prem:   0        SAB:   0  TOP:  0       Ectopic:  0        Living: 1 ---------------------------------------------------------------------- Impression  Significant products still visible within endometrial cavity, this  could be cause of her prolonged bleeding. ---------------------------------------------------------------------- Recommendations  Patient counseled about need for Dilation and Evacuation  procedure, this will be scheduled soon. ----------------------------------------------------------------------               Gloris Hugger, MD Electronically Signed Final Report   12/05/2023 02:26 pm ----------------------------------------------------------------------    Assessment and Plan:     1. Vaginal odor 2. BV (bacterial vaginosis) Treated for presumed BV, will follow up results and manage accordingly. - Cervicovaginal ancillary only( [place]) - metroNIDAZOLE  (FLAGYL ) 500 MG tablet; Take 1 tablet (500 mg total) by mouth 2 (two) times daily for 7 days.  Dispense: 14 tablet; Refill: 0  3. Vaginal bleeding 4. Retained products of conception after miscarriage (Primary) - US  OB Transvaginal; Future - Cervicovaginal ancillary only( Kelly) - CBC - Beta hCG quant (ref lab) - Ambulatory Referral For Surgery Scheduling Patient has retained products of conception after miscarriage.  Will check labs today,will follow up results and manage  accordingly. Counseled about needing Dilation and Evacuation, will do this under ultrasound guidance. She agrees with this plan.  Risks of surgery including bleeding, infection, injury to surrounding organs, need for additional procedures, possibility of intrauterine scarring which may impair future fertility, risk of retained products which may require further management and other postoperative/anesthesia complications were explained to patient.   Patient was told that the likelihood that her condition and symptoms will be treated effectively with this surgical management was very high; the postoperative expectations were also discussed in detail. The patient also understands the alternative treatment options which were discussed in full. All questions were answered.  She was told that she will be contacted by our surgical scheduler regarding the time and date of her surgery; routine preoperative instructions will be given to her by the preoperative nursing team.  Printed patient education handouts about the procedure were given to the patient to review at home.   Routine preventative health maintenance measures emphasized. Please refer to After Visit Summary for other counseling recommendations.   Return in about 2 weeks (around 12/19/2023) for Postop visit with Shanea Karney.    I spent 45 minutes dedicated to the care of this patient including pre-visit review of records, face to face time with the patient discussing her conditions and treatments, post visit ordering of medications and appropriate tests or procedures, coordinating care and documenting this visit encounter.    GLORIS HUGGER, MD, FACOG Obstetrician & Gynecologist, [place] for [employer], [facility]

## 2023-12-07 ENCOUNTER — Encounter (HOSPITAL_COMMUNITY): Payer: Self-pay | Admitting: Obstetrics & Gynecology

## 2023-12-07 ENCOUNTER — Encounter (HOSPITAL_COMMUNITY): Payer: Self-pay | Admitting: Anesthesiology

## 2023-12-07 ENCOUNTER — Ambulatory Visit (HOSPITAL_COMMUNITY)
Admission: RE | Admit: 2023-12-07 | Discharge: 2023-12-07 | Disposition: A | Discharge status: Home / Self Care | Attending: Obstetrics & Gynecology | Admitting: Obstetrics & Gynecology

## 2023-12-07 ENCOUNTER — Encounter (HOSPITAL_COMMUNITY): Admission: RE | Disposition: A | Payer: Self-pay | Source: Home / Self Care | Attending: Obstetrics & Gynecology

## 2023-12-07 ENCOUNTER — Other Ambulatory Visit (HOSPITAL_COMMUNITY): Payer: Self-pay

## 2023-12-07 ENCOUNTER — Ambulatory Visit (HOSPITAL_COMMUNITY): Payer: Self-pay | Admitting: Anesthesiology

## 2023-12-07 ENCOUNTER — Ambulatory Visit (HOSPITAL_COMMUNITY)
Admission: RE | Admit: 2023-12-07 | Discharge: 2023-12-07 | Disposition: A | Discharge status: Home / Self Care | Source: Ambulatory Visit | Attending: Obstetrics & Gynecology | Admitting: Obstetrics & Gynecology

## 2023-12-07 DIAGNOSIS — O039 Complete or unspecified spontaneous abortion without complication: Secondary | ICD-10-CM | POA: Diagnosis not present

## 2023-12-07 DIAGNOSIS — B9689 Other specified bacterial agents as the cause of diseases classified elsewhere: Secondary | ICD-10-CM | POA: Diagnosis not present

## 2023-12-07 DIAGNOSIS — G4733 Obstructive sleep apnea (adult) (pediatric): Secondary | ICD-10-CM | POA: Insufficient documentation

## 2023-12-07 DIAGNOSIS — E038 Other specified hypothyroidism: Secondary | ICD-10-CM | POA: Diagnosis not present

## 2023-12-07 DIAGNOSIS — E039 Hypothyroidism, unspecified: Secondary | ICD-10-CM

## 2023-12-07 DIAGNOSIS — O034 Incomplete spontaneous abortion without complication: Secondary | ICD-10-CM | POA: Diagnosis not present

## 2023-12-07 DIAGNOSIS — Z6841 Body Mass Index (BMI) 40.0 and over, adult: Secondary | ICD-10-CM | POA: Insufficient documentation

## 2023-12-07 DIAGNOSIS — E1169 Type 2 diabetes mellitus with other specified complication: Secondary | ICD-10-CM | POA: Diagnosis not present

## 2023-12-07 DIAGNOSIS — R7303 Prediabetes: Secondary | ICD-10-CM | POA: Diagnosis not present

## 2023-12-07 DIAGNOSIS — N1831 Chronic kidney disease, stage 3a: Secondary | ICD-10-CM | POA: Diagnosis not present

## 2023-12-07 DIAGNOSIS — Z3A01 Less than 8 weeks gestation of pregnancy: Secondary | ICD-10-CM | POA: Diagnosis not present

## 2023-12-07 DIAGNOSIS — E66813 Obesity, class 3: Secondary | ICD-10-CM | POA: Diagnosis not present

## 2023-12-07 DIAGNOSIS — Z3A00 Weeks of gestation of pregnancy not specified: Secondary | ICD-10-CM

## 2023-12-07 DIAGNOSIS — Z01818 Encounter for other preprocedural examination: Secondary | ICD-10-CM

## 2023-12-07 HISTORY — DX: Hyperlipidemia, unspecified: E78.5

## 2023-12-07 HISTORY — DX: Other specified hypothyroidism: E03.8

## 2023-12-07 HISTORY — DX: Iron deficiency anemia, unspecified: D50.9

## 2023-12-07 HISTORY — DX: Presence of spectacles and contact lenses: Z97.3

## 2023-12-07 HISTORY — DX: Abnormal results of kidney function studies: R94.4

## 2023-12-07 HISTORY — PX: DILATION AND EVACUATION: SHX1459

## 2023-12-07 HISTORY — PX: OPERATIVE ULTRASOUND: SHX5996

## 2023-12-07 SURGERY — DILATION AND EVACUATION, UTERUS
Anesthesia: General | Site: Uterus

## 2023-12-07 MED ORDER — KETOROLAC TROMETHAMINE 30 MG/ML IJ SOLN
INTRAMUSCULAR | Status: DC | PRN
Start: 2023-12-07 — End: 2023-12-07
  Administered 2023-12-07: 30 mg via INTRAVENOUS

## 2023-12-07 MED ORDER — PROPOFOL 10 MG/ML IV BOLUS
INTRAVENOUS | Status: DC | PRN
  Administered 2023-12-07: 200 mg via INTRAVENOUS

## 2023-12-07 MED ORDER — LACTATED RINGERS IV SOLN: INTRAVENOUS | Status: DC

## 2023-12-07 MED ORDER — ONDANSETRON HCL 4 MG/2ML IJ SOLN
INTRAMUSCULAR | Status: DC | PRN
Start: 2023-12-07 — End: 2023-12-07
  Administered 2023-12-07: 4 mg via INTRAVENOUS

## 2023-12-07 MED ORDER — ACETAMINOPHEN 500 MG PO TABS
ORAL_TABLET | ORAL | Status: DC
Start: 2023-12-07 — End: 2023-12-07
  Filled 2023-12-07: qty 2

## 2023-12-07 MED ORDER — MISOPROSTOL 200 MCG PO TABS
ORAL_TABLET | ORAL | Status: AC
Start: 2023-12-07 — End: 2023-12-07
  Filled 2023-12-07: qty 5

## 2023-12-07 MED ORDER — ORAL CARE MOUTH RINSE: 15.0000 mL | Freq: Once | OROMUCOSAL | Status: AC

## 2023-12-07 MED ORDER — MIDAZOLAM HCL (PF) 2 MG/2ML IJ SOLN
INTRAMUSCULAR | Status: DC | PRN
  Administered 2023-12-07: 1 mg via INTRAVENOUS

## 2023-12-07 MED ORDER — METHYLERGONOVINE MALEATE 0.2 MG/ML IJ SOLN
INTRAMUSCULAR | Status: AC
  Filled 2023-12-07: qty 1

## 2023-12-07 MED ORDER — FENTANYL CITRATE (PF) 100 MCG/2ML IJ SOLN
INTRAMUSCULAR | Status: AC
  Filled 2023-12-07: qty 2

## 2023-12-07 MED ORDER — LIDOCAINE 2% (20 MG/ML) 5 ML SYRINGE
INTRAMUSCULAR | Status: DC | PRN
Start: 2023-12-07 — End: 2023-12-07
  Administered 2023-12-07: 40 mg via INTRAVENOUS

## 2023-12-07 MED ORDER — FENTANYL CITRATE (PF) 250 MCG/5ML IJ SOLN
INTRAMUSCULAR | Status: DC | PRN
  Administered 2023-12-07: 50 ug via INTRAVENOUS

## 2023-12-07 MED ORDER — PROPOFOL 10 MG/ML IV BOLUS
INTRAVENOUS | Status: AC
  Filled 2023-12-07: qty 20

## 2023-12-07 MED ORDER — IBUPROFEN 600 MG PO TABS
600.0000 mg | ORAL_TABLET | Freq: Four times a day (QID) | ORAL | 2 refills | Status: DC | PRN
  Filled 2023-12-07: qty 30, 8d supply, fill #0

## 2023-12-07 MED ORDER — CELECOXIB 200 MG PO CAPS
ORAL_CAPSULE | ORAL | Status: AC
  Filled 2023-12-07: qty 2

## 2023-12-07 MED ORDER — BUPIVACAINE HCL 0.5 % IJ SOLN
INTRAMUSCULAR | Status: DC | PRN
  Administered 2023-12-07: 10 mL

## 2023-12-07 MED ORDER — GABAPENTIN 300 MG PO CAPS
ORAL_CAPSULE | ORAL | Status: AC
  Filled 2023-12-07: qty 1

## 2023-12-07 MED ORDER — ONDANSETRON HCL 4 MG/2ML IJ SOLN
INTRAMUSCULAR | Status: AC
  Filled 2023-12-07: qty 2

## 2023-12-07 MED ORDER — GABAPENTIN 300 MG PO CAPS
300.0000 mg | ORAL_CAPSULE | ORAL | Status: AC
  Administered 2023-12-07: 300 mg via ORAL

## 2023-12-07 MED ORDER — CHLORHEXIDINE GLUCONATE 0.12 % MT SOLN
15.0000 mL | Freq: Once | OROMUCOSAL | Status: AC
  Administered 2023-12-07: 15 mL via OROMUCOSAL

## 2023-12-07 MED ORDER — LIDOCAINE 2% (20 MG/ML) 5 ML SYRINGE
INTRAMUSCULAR | Status: AC
  Filled 2023-12-07: qty 5

## 2023-12-07 MED ORDER — OXYCODONE HCL 5 MG PO TABS
5.0000 mg | ORAL_TABLET | ORAL | 0 refills | Status: DC | PRN
  Filled 2023-12-07: qty 15, 3d supply, fill #0

## 2023-12-07 MED ORDER — BUPIVACAINE HCL (PF) 0.5 % IJ SOLN
INTRAMUSCULAR | Status: AC
  Filled 2023-12-07: qty 30

## 2023-12-07 MED ORDER — CARBOPROST TROMETHAMINE 250 MCG/ML IM SOLN
INTRAMUSCULAR | Status: AC
  Filled 2023-12-07: qty 1

## 2023-12-07 MED ORDER — TRANEXAMIC ACID-NACL 1000-0.7 MG/100ML-% IV SOLN
INTRAVENOUS | Status: AC
  Filled 2023-12-07: qty 100

## 2023-12-07 MED ORDER — ACETAMINOPHEN 500 MG PO TABS
1000.0000 mg | ORAL_TABLET | ORAL | Status: AC
  Administered 2023-12-07: 1000 mg via ORAL

## 2023-12-07 MED ORDER — CELECOXIB 200 MG PO CAPS
400.0000 mg | ORAL_CAPSULE | ORAL | Status: AC
  Administered 2023-12-07: 400 mg via ORAL

## 2023-12-07 MED ORDER — DEXTROSE 5 % IV SOLN
200.0000 mg | INTRAVENOUS | Status: AC
  Administered 2023-12-07: 200 mg via INTRAVENOUS
  Filled 2023-12-07: qty 200

## 2023-12-07 MED ORDER — CHLORHEXIDINE GLUCONATE 0.12 % MT SOLN
OROMUCOSAL | Status: DC
Start: 2023-12-07 — End: 2023-12-07
  Filled 2023-12-07: qty 15

## 2023-12-07 MED ORDER — KETOROLAC TROMETHAMINE 30 MG/ML IJ SOLN
INTRAMUSCULAR | Status: AC
  Filled 2023-12-07: qty 1

## 2023-12-07 MED ORDER — MIDAZOLAM HCL 2 MG/2ML IJ SOLN
INTRAMUSCULAR | Status: AC
  Filled 2023-12-07: qty 2

## 2023-12-07 MED ORDER — 0.9 % SODIUM CHLORIDE (POUR BTL) OPTIME
TOPICAL | Status: DC | PRN
  Administered 2023-12-07: 1000 mL

## 2023-12-07 SURGICAL SUPPLY — 17 items
FILTER UTR ASPR ASSEMBLY (MISCELLANEOUS) ×2 IMPLANT
GLOVE BIOGEL PI IND STRL 6 (GLOVE) IMPLANT
GLOVE BIOGEL PI IND STRL 7.0 (GLOVE) ×2 IMPLANT
GLOVE ECLIPSE 7.0 STRL STRAW (GLOVE) ×2 IMPLANT
GOWN STRL REUS W/ TWL LRG LVL3 (GOWN DISPOSABLE) ×4 IMPLANT
HOSE CONNECTING 18IN BERKELEY (TUBING) ×2 IMPLANT
KIT BERKELEY 1ST TRI 3/8 NO TR (MISCELLANEOUS) ×2 IMPLANT
KIT BERKELEY 1ST TRIMESTER 3/8 (MISCELLANEOUS) ×2 IMPLANT
PACK VAGINAL MINOR WOMEN LF (CUSTOM PROCEDURE TRAY) ×2 IMPLANT
PAD OB MATERNITY 11 LF (PERSONAL CARE ITEMS) ×2 IMPLANT
SET BERKELEY SUCTION TUBING (SUCTIONS) ×2 IMPLANT
SOL PREP POV-IOD 4OZ 10% (MISCELLANEOUS) IMPLANT
SOLN 0.9% NACL POUR BTL 1000ML (IV SOLUTION) ×2 IMPLANT
TOWEL GREEN STERILE FF (TOWEL DISPOSABLE) ×4 IMPLANT
UNDERPAD 30X36 HEAVY ABSORB (UNDERPADS AND DIAPERS) ×2 IMPLANT
VACURETTE 7MM CVD STRL WRAP (CANNULA) IMPLANT
VACURETTE 8 RIGID CVD (CANNULA) IMPLANT

## 2023-12-07 NOTE — Interval H&P Note (Signed)
 History and Physical Interval Note 12/07/2023 3:06 PM  Erin Tucker  has presented today for surgery, with the diagnosis of retained products of conception after miscarriage.  The various methods of treatment have been discussed with the patient and family. After consideration of risks, benefits and other options for treatment, the patient has consented to  Procedure(s): DILATION AND EVACUATION OF UTERUS UNDER ULTRASOUND GUIDANCE as a surgical intervention.  The patient's history has been reviewed, patient examined, no change in status, stable for surgery.  I have reviewed the patient's chart and labs.  Questions were answered to the patient's satisfaction.  To OR when ready.   GLORIS HUGGER, MD, FACOG Obstetrician & Gynecologist, [facility] for [employer], [facility]

## 2023-12-07 NOTE — Op Note (Signed)
 Erin Tucker PROCEDURE DATE:  12/07/2023  PREOPERATIVE DIAGNOSIS: Retained products of conception after miscarriage POSTOPERATIVE DIAGNOSIS: The same PROCEDURE:  Dilation and Evacuation under ultrasound guidance SURGEON:  Dr. Gloris Hugger  INDICATIONS: 38 y.o.  G2P1001 with retained products of conception after miscarriage needing surgical management.  Risks of surgery were discussed with the patient including but not limited to: bleeding which may require transfusion; infection which may require antibiotics; injury to uterus or surrounding organs; need for additional procedures including laparotomy or laparoscopy; possibility of intrauterine scarring which may impair future fertility; and other postoperative/anesthesia complications. Written informed consent was obtained.  FINDINGS:   Significant amount of retained products of conception.  Empty endometrial stripe noted on ultrasound at the end of the procedure.   ANESTHESIA:    General, paracervical block with 30 ml of 0.5% Marcaine ESTIMATED BLOOD LOSS:  50 ml. SPECIMENS:  Products of conception sent to pathology COMPLICATIONS:  None immediate.  PROCEDURE DETAILS:  The patient received intravenous Doxycycline  while in the preoperative area.  She was then taken to the operating room where anesthesia  was administered and was found to be adequate.  After an adequate timeout was performed, she was placed in the dorsal lithotomy position and examined; then prepped and draped in the sterile manner.   A vaginal speculum was then placed in the patient's vagina and a single tooth tenaculum was applied to the anterior lip of the cervix.  A paracervical block using 30 ml of 0.5% Marcaine was administered. The cervix was gently dilated under ultrasound guidance to accommodate a 8 mm suction curette that was gently advanced to the uterine fundus.  The suction device was then activated and curette slowly rotated to clear the uterus of products of  conception.  A sharp curettage was then performed to confirm complete emptying of the uterus. There was an empty endometrial stripe noted on the ultrasound at the end of the curettage. There was minimal bleeding noted at the end of the procedure, and the tenaculum removed with good hemostasis noted.   All instruments were removed from the patient's vagina.  Sponge and instrument counts were correct times three.    The patient tolerated the procedure well and was taken to the recovery area awake, extubated and in stable condition.  The patient will be discharged to home as per PACU criteria.  Routine postoperative instructions given.  She was prescribed Oxycodone and Ibuprofen .  She will follow up in the office in 2 weeks for postoperative evaluation.   GLORIS HUGGER, MD, FACOG Obstetrician & Gynecologist, [facility] for [employer], [facility]

## 2023-12-07 NOTE — Transfer of Care (Signed)
 Immediate Anesthesia Transfer of Care Note  Patient: Erin Tucker  Procedure(s) Performed: DILATION AND EVACUATION, UTERUS (Uterus) US  INTRAOPERATIVE (Abdomen)  Patient Location: PACU  Anesthesia Type:General  Level of Consciousness: drowsy  Airway & Oxygen Therapy: Patient connected to face mask oxygen  Post-op Assessment: Report given to RN and Post -op Vital signs reviewed and stable  Post vital signs: Reviewed and stable  Last Vitals:  Vitals Value Taken Time  BP 123/74 12/07/23 16:06  Temp    Pulse 59 12/07/23 16:07  Resp 18 12/07/23 16:07  SpO2 100 % 12/07/23 16:07  Vitals shown include unfiled device data.  Last Pain:  Vitals:   12/07/23 1251  TempSrc: Oral  PainSc: 0-No pain      Patients Stated Pain Goal: 5 (12/07/23 1251)  Complications: There were no known notable events for this encounter.

## 2023-12-07 NOTE — Anesthesia Procedure Notes (Signed)
 Procedure Name: LMA Insertion Date/Time: 12/07/2023 3:32 PM  Performed by: Christopher Comings, CRNAPre-anesthesia Checklist: Patient identified, Emergency Drugs available, Suction available and Patient being monitored Patient Re-evaluated:Patient Re-evaluated prior to induction Oxygen Delivery Method: Circle system utilized Preoxygenation: Pre-oxygenation with 100% oxygen Induction Type: IV induction Ventilation: Mask ventilation without difficulty LMA: LMA inserted LMA Size: 4.0 Tube type: Oral Number of attempts: 1 Placement Confirmation: positive ETCO2 and breath sounds checked- equal and bilateral Tube secured with: Tape Dental Injury: Teeth and Oropharynx as per pre-operative assessment

## 2023-12-08 ENCOUNTER — Other Ambulatory Visit: Payer: Self-pay

## 2023-12-08 NOTE — Anesthesia Postprocedure Evaluation (Signed)
 Anesthesia Post Note  Patient: Erin Tucker  Procedure(s) Performed: DILATION AND EVACUATION, UTERUS (Uterus) US  INTRAOPERATIVE (Abdomen)     Patient location during evaluation: PACU Anesthesia Type: General Level of consciousness: awake Pain management: pain level controlled Vital Signs Assessment: post-procedure vital signs reviewed and stable Respiratory status: spontaneous breathing, nonlabored ventilation and respiratory function stable Cardiovascular status: blood pressure returned to baseline and stable Postop Assessment: no apparent nausea or vomiting Anesthetic complications: no   There were no known notable events for this encounter.  Last Vitals:  Vitals:   12/07/23 1645 12/07/23 1700  BP: 115/68 124/80  Pulse: (!) 56 (!) 55  Resp: 14 13  Temp:  36.7 C  SpO2: 100% 100%    Last Pain:  Vitals:   12/07/23 1700  TempSrc:   PainSc: 3                  Sadiq Mccauley P Donnell Beauchamp

## 2023-12-09 ENCOUNTER — Encounter (HOSPITAL_COMMUNITY): Payer: Self-pay | Admitting: Obstetrics & Gynecology

## 2023-12-10 ENCOUNTER — Other Ambulatory Visit (HOSPITAL_COMMUNITY): Payer: Self-pay | Admitting: Obstetrics & Gynecology

## 2023-12-10 DIAGNOSIS — O039 Complete or unspecified spontaneous abortion without complication: Secondary | ICD-10-CM

## 2023-12-11 ENCOUNTER — Ambulatory Visit: Payer: Self-pay | Admitting: Obstetrics & Gynecology

## 2023-12-11 ENCOUNTER — Other Ambulatory Visit: Payer: Self-pay | Admitting: Obstetrics & Gynecology

## 2023-12-11 ENCOUNTER — Other Ambulatory Visit: Payer: Self-pay

## 2023-12-11 LAB — SURGICAL PATHOLOGY

## 2023-12-11 MED FILL — Fluconazole Tab 150 MG: ORAL | 1 days supply | Qty: 1 | Fill #0 | Status: CN

## 2023-12-18 ENCOUNTER — Ambulatory Visit (INDEPENDENT_AMBULATORY_CARE_PROVIDER_SITE_OTHER): Admitting: Obstetrics & Gynecology

## 2023-12-18 ENCOUNTER — Encounter: Payer: Self-pay | Admitting: Obstetrics & Gynecology

## 2023-12-18 ENCOUNTER — Other Ambulatory Visit: Payer: Self-pay

## 2023-12-18 VITALS — BP 128/78 | HR 98 | Wt 225.0 lb

## 2023-12-18 DIAGNOSIS — B3731 Acute candidiasis of vulva and vagina: Secondary | ICD-10-CM

## 2023-12-18 DIAGNOSIS — Z09 Encounter for follow-up examination after completed treatment for conditions other than malignant neoplasm: Secondary | ICD-10-CM

## 2023-12-18 MED ORDER — FLUCONAZOLE 150 MG PO TABS
150.0000 mg | ORAL_TABLET | ORAL | 3 refills | Status: DC
  Filled 2023-12-18: qty 3, 9d supply, fill #0

## 2023-12-18 NOTE — Patient Instructions (Signed)
 Research Paragard (nonhormonal) vs Mirena or Kyleena (hormonal, Kyleena has less)

## 2023-12-18 NOTE — Progress Notes (Signed)
   GYNECOLOGY POSTOPERATIVE NOTE   Subjective:     Erin Tucker is a 38 y.o. female who presents to the clinic 2 weeks status post  Dilation and Evacuation under ultrasound guidance for retained products of conception after miscarriage.  Eating a regular diet without difficulty. Bowel movements are normal. The patient is not having any pain.  Desires refill of her Diflucan  for yeast vaginitis.  The following portions of the patient's history were reviewed and updated as appropriate: allergies, current medications, past family history, past medical history, past social history, past surgical history, and problem list.   Normal pap with negative HRHPV on 08/18/2022.  Review of Systems Pertinent items noted in HPI and remainder of comprehensive ROS otherwise negative.    Objective:    BP 128/78 (Cuff Size: Normal)   Pulse 98   Wt 225 lb (102.1 kg)   BMI 41.15 kg/m  General:  alert and no distress  Abdomen: soft, bowel sounds active, non-tender  Pelvic:  deferred   12/18/2023 Surgical Pathology PRODUCTS OF CONCEPTION, DILATION AND EVACUATION:  Chorionic villi consistent with products of conception.      Assessment:    Doing well postoperatively. Operative findings again reviewed. Pathology report discussed.    Plan:    1. Continue any current medications; refill for Diflucan  given to her as requested. 2. Discussed plans for future pregnancy, she wants contraception for now.  Reviewed the various modalities, all questions answered. Information given to her to review at home.  She will let us  know once she decides but will use condoms for now. 3. Activity restrictions: none 4.No other concerns.     GLORIS HUGGER, MD, FACOG Obstetrician & Gynecologist, [facility], [place] for [employer], [facility]

## 2023-12-21 ENCOUNTER — Encounter: Payer: Self-pay | Admitting: Obstetrics & Gynecology

## 2024-01-28 ENCOUNTER — Ambulatory Visit: Payer: Self-pay | Admitting: Family Medicine

## 2024-01-28 VITALS — BP 120/70 | HR 83 | Temp 98.3°F | Ht 62.0 in | Wt 224.9 lb

## 2024-01-28 DIAGNOSIS — N1831 Chronic kidney disease, stage 3a: Secondary | ICD-10-CM

## 2024-01-28 DIAGNOSIS — E66813 Obesity, class 3: Secondary | ICD-10-CM | POA: Diagnosis not present

## 2024-01-28 DIAGNOSIS — E662 Morbid (severe) obesity with alveolar hypoventilation: Secondary | ICD-10-CM

## 2024-01-28 DIAGNOSIS — G4733 Obstructive sleep apnea (adult) (pediatric): Secondary | ICD-10-CM

## 2024-01-28 DIAGNOSIS — E7849 Other hyperlipidemia: Secondary | ICD-10-CM | POA: Diagnosis not present

## 2024-01-28 DIAGNOSIS — Z6841 Body Mass Index (BMI) 40.0 and over, adult: Secondary | ICD-10-CM | POA: Diagnosis not present

## 2024-01-28 DIAGNOSIS — E038 Other specified hypothyroidism: Secondary | ICD-10-CM

## 2024-01-28 DIAGNOSIS — R7303 Prediabetes: Secondary | ICD-10-CM

## 2024-01-28 DIAGNOSIS — Z0000 Encounter for general adult medical examination without abnormal findings: Secondary | ICD-10-CM

## 2024-01-28 NOTE — Patient Instructions (Addendum)
-   Obtain fasting labs with orders provided (can have water or black coffee but otherwise no food or drink x 8 hours before labs) - Review information provided - Attend eye doctor annually, dentist every 6 months, work towards or maintain 30 minutes of moderate intensity physical activity at least 5 days per week, and consume a balanced diet - Return in 1 year for physical - Contact us  for any questions between now and then   VISIT SUMMARY:  Today, we discussed your weight management options, sleep apnea, and reviewed your kidney function and post-traumatic symptoms. We also planned for further evaluation of your thyroid  and diabetes status.  YOUR PLAN:  OBESITY: Your weight has increased to 224 lbs. We discussed the possibility of using Zepbound for weight management, pending the results of a new sleep study. -We ordered lab tests to check your thyroid  and diabetes status. -You have been referred to sleep medicine for an updated sleep study. -We will discuss the use of Zepbound if your sleep study shows moderate to severe sleep apnea. -Continue your current exercise routine with active recovery.  OBSTRUCTIVE SLEEP APNEA: You have mild sleep apnea and have been using your CPAP machine inconsistently. -You have been referred to sleep medicine for an updated sleep study. -It is important to use your CPAP machine consistently to manage your sleep apnea.  PREDIABETES: We need to reassess your diabetes status. -We ordered lab tests to check your diabetes status.  SUBCLINICAL HYPOTHYROIDISM: We need to reassess your thyroid  function. -We ordered lab tests to check your thyroid  function.
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
## 2024-01-30 DIAGNOSIS — Z0000 Encounter for general adult medical examination without abnormal findings: Secondary | ICD-10-CM | POA: Diagnosis not present

## 2024-01-30 DIAGNOSIS — E038 Other specified hypothyroidism: Secondary | ICD-10-CM | POA: Diagnosis not present

## 2024-01-30 DIAGNOSIS — R7303 Prediabetes: Secondary | ICD-10-CM | POA: Diagnosis not present

## 2024-01-31 LAB — COMPREHENSIVE METABOLIC PANEL WITH GFR
ALT: 15 IU/L (ref 0–32)
AST: 15 IU/L (ref 0–40)
Albumin: 4.3 g/dL (ref 3.9–4.9)
Alkaline Phosphatase: 76 IU/L (ref 41–116)
BUN/Creatinine Ratio: 12 (ref 9–23)
BUN: 13 mg/dL (ref 6–20)
Bilirubin Total: 0.4 mg/dL (ref 0.0–1.2)
CO2: 25 mmol/L (ref 20–29)
Calcium: 9.6 mg/dL (ref 8.7–10.2)
Chloride: 101 mmol/L (ref 96–106)
Creatinine, Ser: 1.07 mg/dL — ABNORMAL HIGH (ref 0.57–1.00)
Globulin, Total: 2.5 g/dL (ref 1.5–4.5)
Glucose: 95 mg/dL (ref 70–99)
Potassium: 4.6 mmol/L (ref 3.5–5.2)
Sodium: 141 mmol/L (ref 134–144)
Total Protein: 6.8 g/dL (ref 6.0–8.5)
eGFR: 69 mL/min/1.73 (ref >59)

## 2024-01-31 LAB — TSH+FREE T4
Free T4: 0.94 ng/dL (ref 0.82–1.77)
TSH: 2.44 u[IU]/mL (ref 0.450–4.500)

## 2024-01-31 LAB — CBC
Hematocrit: 36.1 % (ref 34.0–46.6)
Hemoglobin: 11.4 g/dL (ref 11.1–15.9)
MCH: 27.3 pg (ref 26.6–33.0)
MCHC: 31.6 g/dL (ref 31.5–35.7)
MCV: 86 fL (ref 79–97)
Platelets: 382 x10E3/uL (ref 150–450)
RBC: 4.18 x10E6/uL (ref 3.77–5.28)
RDW: 14 % (ref 11.7–15.4)
WBC: 5.1 x10E3/uL (ref 3.4–10.8)

## 2024-01-31 LAB — LIPID PANEL
Chol/HDL Ratio: 3.2 ratio (ref 0.0–4.4)
Cholesterol, Total: 242 mg/dL — ABNORMAL HIGH (ref 100–199)
HDL: 76 mg/dL (ref >39)
LDL Chol Calc (NIH): 151 mg/dL — ABNORMAL HIGH (ref 0–99)
Triglycerides: 87 mg/dL (ref 0–149)
VLDL Cholesterol Cal: 15 mg/dL (ref 5–40)

## 2024-01-31 LAB — HEMOGLOBIN A1C
Est. average glucose Bld gHb Est-mCnc: 108 mg/dL
Hgb A1c MFr Bld: 5.4 % (ref 4.8–5.6)

## 2024-02-05 ENCOUNTER — Ambulatory Visit (INDEPENDENT_AMBULATORY_CARE_PROVIDER_SITE_OTHER): Admitting: Sleep Medicine

## 2024-02-05 ENCOUNTER — Encounter: Payer: Self-pay | Admitting: Sleep Medicine

## 2024-02-05 VITALS — BP 120/80 | HR 76 | Temp 97.9°F | Ht 62.0 in | Wt 228.6 lb

## 2024-02-05 DIAGNOSIS — G4733 Obstructive sleep apnea (adult) (pediatric): Secondary | ICD-10-CM | POA: Diagnosis not present

## 2024-02-05 DIAGNOSIS — Z6841 Body Mass Index (BMI) 40.0 and over, adult: Secondary | ICD-10-CM

## 2024-02-05 DIAGNOSIS — Z713 Dietary counseling and surveillance: Secondary | ICD-10-CM

## 2024-02-05 NOTE — Patient Instructions (Signed)
 Erin Tucker

## 2024-02-05 NOTE — Progress Notes (Signed)
 "      Name:Erin Tucker MRN: [id number] DOB: [birth year]   CHIEF COMPLAINT:  ESTABLISH CARE FOR OSA   HISTORY OF PRESENT ILLNESS: Ms. Prigmore is a 38 y.o. w/ a h/o OSA and morbid obesity who presents to establish care for OSA. Reports that she was initially diagnosed with mild OSA a few years ago and then was subsequently started on CPAP therapy. Reports using CPAP therapy consistently initially but then decreased usage due to mask discomfort. She is currently using the Airfit P10 nasal pillow mask. Reports feeling refreshed upon awakening with CPAP therapy.   Reports nocturnal awakenings due to unclear reasons, however does not have difficulty falling back to sleep. Reports significant weight changes. Denies morning headaches, RLS symptoms, dream enactment, cataplexy, hypnagogic or hypnapompic hallucinations. Denies a family history of sleep apnea. Reports drowsy driving. Drinks 1 cup of coffee daily, occasional alcohol use, denies tobacco or illicit drug use.   Bedtime 9-11 pm Sleep onset 10 mins Rise time 7-7:15 am   EPWORTH SLEEP SCORE 16    02/05/2024    8:00 AM  Results of the Epworth flowsheet  Sitting and reading 2  Watching TV 3  Sitting, inactive in a public place (e.g. a theatre or a meeting) 1  As a passenger in a car for an hour without a break 3  Lying down to rest in the afternoon when circumstances permit 3  Sitting and talking to someone 1  Sitting quietly after a lunch without alcohol 1  In a car, while stopped for a few minutes in traffic 2  Total score 16    PAST MEDICAL HISTORY :   has a past medical history of Abnormal renal function test, Allergy, Chronic back pain, Chronic neck pain, Fatigue, Herpes genitalis, History of abnormal cervical Pap smear, Hyperlipidemia, Iron deficiency anemia, Irregular menses, Miscarriage (11/2023), Obesity (BMI 35.0-39.9 without comorbidity), OSA on CPAP (12/2021), Pre-diabetes, Sleep apnea, Subclinical hypothyroidism,  Vitamin D  deficiency, and Wears contact lenses.  has a past surgical history that includes Cesarean section (12/14/2013); Thyroidectomy, partial (Right, 07/03/2016); Wisdom tooth extraction; Dilation and evacuation (N/A, 12/07/2023); and Operative ultrasound (N/A, 12/07/2023). Prior to Admission medications  Medication Sig Start Date End Date Taking? Authorizing Provider  Ferrous Sulfate (IRON) 325 (65 Fe) MG TABS Take 1 tablet by mouth daily.   Yes [provider]   Allergies[1]  FAMILY HISTORY:  family history includes Asthma in her sister; Cancer in her brother and maternal grandfather; Colon cancer in her maternal grandfather; Diabetes in her father; Drug abuse in her father; Early death in her paternal grandfather; High blood pressure in her father; Obesity in her mother; Prostate cancer in her maternal grandfather. SOCIAL HISTORY:  reports that she has never smoked. She has never used smokeless tobacco. She reports that she does not currently use alcohol. She reports that she does not use drugs.   Review of Systems:  Gen:  Denies  fever, sweats, chills weight loss  HEENT: Denies blurred vision, double vision, ear pain, eye pain, hearing loss, nose bleeds, sore throat Cardiac:  No dizziness, chest pain or heaviness, chest tightness,edema, No JVD Resp:   No cough, -sputum production, -shortness of breath,-wheezing, -hemoptysis,  Gi: Denies swallowing difficulty, stomach pain, nausea or vomiting, diarrhea, constipation, bowel incontinence Gu:  Denies bladder incontinence, burning urine Ext:   Denies Joint pain, stiffness or swelling Skin: Denies  skin rash, easy bruising or bleeding or hives Endoc:  Denies polyuria, polydipsia , polyphagia or weight change  Psych:   Denies depression, insomnia or hallucinations  Other:  All other systems negative  VITAL SIGNS: BP 120/80   Pulse 76   Temp 97.9 F (36.6 C)   Ht 5' 2 (1.575 m)   Wt 228 lb 9.6 oz (103.7 kg)   LMP 01/19/2024  (Within Days)   SpO2 100%   Breastfeeding No   BMI 41.81 kg/m    Physical Examination:   General Appearance: No distress  EYES PERRLA, EOM intact.   NECK Supple, No JVD Pulmonary: normal breath sounds, No wheezing.  CardiovascularNormal S1,S2.  No m/r/g.   Abdomen: Benign, Soft, non-tender. Skin:   warm, no rashes, no ecchymosis  Extremities: normal, no cyanosis, clubbing. Neuro:without focal findings,  speech normal  PSYCHIATRIC: Mood, affect within normal limits.   ASSESSMENT AND PLAN  OSA Due to significant weight gain, will reassess apnea with HST. Additionally, will try patient on the Airtouch N30i nasal mask to improve comfort and CPAP compliance. Discussed the consequences of untreated sleep apnea. Advised not to drive drowsy for safety of patient and others. Will follow up in 3 months.    Morbid obesity Counseled patient on diet and lifestyle modification. Will reassess apnea with HST and see if patient qualifies for Zepbound.    MEDICATION ADJUSTMENTS/LABS AND TESTS ORDERED: Recommend Sleep Study   Patient  satisfied with Plan of action and management. All questions answered  Follow up to review HST results and treatment plan.   I spent a total of 48 minutes reviewing chart data, face-to-face evaluation with the patient, counseling and coordination of care as detailed above.    Iridiana Fonner, M.D.  Sleep Medicine [facility]           [1]  Allergies Allergen Reactions   Mangifera Indica Itching and Swelling    THIS THE NAME FOR --- MANGO FRUIT   Other Itching and Swelling    RED HI-C DRINK   "

## 2024-02-15 ENCOUNTER — Ambulatory Visit: Payer: Self-pay | Admitting: Family Medicine

## 2024-02-27 ENCOUNTER — Encounter

## 2024-02-27 DIAGNOSIS — G4733 Obstructive sleep apnea (adult) (pediatric): Secondary | ICD-10-CM

## 2024-03-05 DIAGNOSIS — G4733 Obstructive sleep apnea (adult) (pediatric): Secondary | ICD-10-CM | POA: Diagnosis not present

## 2024-03-06 NOTE — Telephone Encounter (Signed)
 Please advise patient. JM

## 2024-03-12 ENCOUNTER — Ambulatory Visit: Payer: Self-pay

## 2024-03-20 ENCOUNTER — Ambulatory Visit: Admitting: Sleep Medicine

## 2024-03-20 ENCOUNTER — Encounter: Payer: Self-pay | Admitting: Sleep Medicine

## 2024-03-20 VITALS — BP 120/78 | HR 72 | Temp 98.0°F | Ht 62.0 in | Wt 226.8 lb

## 2024-03-20 DIAGNOSIS — G4733 Obstructive sleep apnea (adult) (pediatric): Secondary | ICD-10-CM

## 2024-03-20 DIAGNOSIS — Z6841 Body Mass Index (BMI) 40.0 and over, adult: Secondary | ICD-10-CM

## 2024-03-20 NOTE — Progress Notes (Signed)
 "      Name:Erin Tucker MRN: [id number] DOB: 1985-10-15   CHIEF COMPLAINT:  HST F/U   HISTORY OF PRESENT ILLNESS: Ms. Gilani is a 39 y.o. w/ a h/o OSA and morbid obesity who presents to follow up on HST results. The patient underwent HST which revealed mild to moderate OSA (AHI 13, O2 nadir 88%).   Patient reports decreased CPAP usage due to falling asleep frequently without her mask.    EPWORTH SLEEP SCORE 16    02/05/2024    8:00 AM  Results of the Epworth flowsheet  Sitting and reading 2  Watching TV 3  Sitting, inactive in a public place (e.g. a theatre or a meeting) 1  As a passenger in a car for an hour without a break 3  Lying down to rest in the afternoon when circumstances permit 3  Sitting and talking to someone 1  Sitting quietly after a lunch without alcohol 1  In a car, while stopped for a few minutes in traffic 2  Total score 16    PAST MEDICAL HISTORY :   has a past medical history of Abnormal renal function test, Allergy, Chronic back pain, Chronic neck pain, Fatigue, Herpes genitalis, History of abnormal cervical Pap smear, Hyperlipidemia, Iron deficiency anemia, Irregular menses, Miscarriage (11/2023), Obesity (BMI 35.0-39.9 without comorbidity), OSA on CPAP (12/2021), Pre-diabetes, Sleep apnea, Subclinical hypothyroidism, Vitamin D  deficiency, and Wears contact lenses.  has a past surgical history that includes Cesarean section (12/14/2013); Thyroidectomy, partial (Right, 07/03/2016); Wisdom tooth extraction; Dilation and evacuation (N/A, 12/07/2023); and Operative ultrasound (N/A, 12/07/2023). Prior to Admission medications  Medication Sig Start Date End Date Taking? Authorizing Provider  Ferrous Sulfate (IRON) 325 (65 Fe) MG TABS Take 1 tablet by mouth daily.   Yes [provider]   Allergies[1]  FAMILY HISTORY:  family history includes Asthma in her sister; Cancer in her brother and maternal grandfather; Colon cancer in her maternal  grandfather; Diabetes in her father; Drug abuse in her father; Early death in her paternal grandfather; High blood pressure in her father; Obesity in her mother; Prostate cancer in her maternal grandfather. SOCIAL HISTORY:  reports that she has never smoked. She has never used smokeless tobacco. She reports that she does not currently use alcohol. She reports that she does not use drugs.   Review of Systems:  Gen:  Denies  fever, sweats, chills weight loss  HEENT: Denies blurred vision, double vision, ear pain, eye pain, hearing loss, nose bleeds, sore throat Cardiac:  No dizziness, chest pain or heaviness, chest tightness,edema, No JVD Resp:   No cough, -sputum production, -shortness of breath,-wheezing, -hemoptysis,  Gi: Denies swallowing difficulty, stomach pain, nausea or vomiting, diarrhea, constipation, bowel incontinence Gu:  Denies bladder incontinence, burning urine Ext:   Denies Joint pain, stiffness or swelling Skin: Denies  skin rash, easy bruising or bleeding or hives Endoc:  Denies polyuria, polydipsia , polyphagia or weight change Psych:   Denies depression, insomnia or hallucinations  Other:  All other systems negative  VITAL SIGNS: BP 120/78   Pulse 72   Temp 98 F (36.7 C)   Ht 5' 2 (1.575 m)   Wt 226 lb 12.8 oz (102.9 kg)   SpO2 99%   BMI 41.48 kg/m     Physical Examination:   General Appearance: No distress  EYES PERRLA, EOM intact.   NECK Supple, No JVD Pulmonary: normal breath sounds, No wheezing.  CardiovascularNormal S1,S2.  No m/r/g.   Abdomen: Benign,  Soft, non-tender. Skin:   warm, no rashes, no ecchymosis  Extremities: normal, no cyanosis, clubbing. Neuro:without focal findings,  speech normal  PSYCHIATRIC: Mood, affect within normal limits.   ASSESSMENT AND PLAN  OSA Counseled patient on increasing CPAP compliance. Discussed the consequences of untreated sleep apnea. Advised not to drive drowsy for safety of patient and others. Will follow  up in 3 months.    Morbid obesity Counseled patient on diet and lifestyle modification.    Patient  satisfied with Plan of action and management. All questions answered  I spent a total of 31 minutes reviewing chart data, face-to-face evaluation with the patient, counseling and coordination of care as detailed above.    Sanela Evola, M.D.  Sleep Medicine [facility]           [1]  Allergies Allergen Reactions   Mangifera Indica Itching and Swelling    THIS THE NAME FOR --- MANGO FRUIT   Other Itching and Swelling    RED HI-C DRINK   "

## 2025-01-28 ENCOUNTER — Encounter: Admitting: Family Medicine
